# Patient Record
Sex: Female | Born: 1955 | Race: White | Hispanic: No | Smoking: Former smoker
Health system: Southern US, Community
[De-identification: ages and names within clinical notes are randomized; demographics above are authoritative.]

## PROBLEM LIST (undated history)

## (undated) DIAGNOSIS — F32A Depression, unspecified: Secondary | ICD-10-CM

## (undated) DIAGNOSIS — E538 Deficiency of other specified B group vitamins: Secondary | ICD-10-CM

## (undated) DIAGNOSIS — G43909 Migraine, unspecified, not intractable, without status migrainosus: Secondary | ICD-10-CM

## (undated) DIAGNOSIS — F329 Major depressive disorder, single episode, unspecified: Secondary | ICD-10-CM

## (undated) DIAGNOSIS — F419 Anxiety disorder, unspecified: Secondary | ICD-10-CM

## (undated) DIAGNOSIS — E785 Hyperlipidemia, unspecified: Secondary | ICD-10-CM

## (undated) DIAGNOSIS — L409 Psoriasis, unspecified: Secondary | ICD-10-CM

## (undated) DIAGNOSIS — Z8619 Personal history of other infectious and parasitic diseases: Secondary | ICD-10-CM

## (undated) DIAGNOSIS — T7840XA Allergy, unspecified, initial encounter: Secondary | ICD-10-CM

## (undated) DIAGNOSIS — Z860100 Personal history of colon polyps, unspecified: Secondary | ICD-10-CM

## (undated) DIAGNOSIS — K5792 Diverticulitis of intestine, part unspecified, without perforation or abscess without bleeding: Secondary | ICD-10-CM

## (undated) DIAGNOSIS — K219 Gastro-esophageal reflux disease without esophagitis: Secondary | ICD-10-CM

## (undated) HISTORY — DX: Major depressive disorder, single episode, unspecified: F32.9

## (undated) HISTORY — DX: Hyperlipidemia, unspecified: E78.5

## (undated) HISTORY — DX: Personal history of colonic polyps: Z86.010

## (undated) HISTORY — DX: Deficiency of other specified B group vitamins: E53.8

## (undated) HISTORY — DX: Personal history of colon polyps, unspecified: Z86.0100

## (undated) HISTORY — DX: Migraine, unspecified, not intractable, without status migrainosus: G43.909

## (undated) HISTORY — PX: BREAST CYST ASPIRATION: SHX578

## (undated) HISTORY — PX: CHOLECYSTECTOMY: SHX55

## (undated) HISTORY — DX: Anxiety disorder, unspecified: F41.9

## (undated) HISTORY — PX: BREAST SURGERY: SHX581

## (undated) HISTORY — DX: Psoriasis, unspecified: L40.9

## (undated) HISTORY — DX: Personal history of other infectious and parasitic diseases: Z86.19

## (undated) HISTORY — PX: ABDOMINAL HYSTERECTOMY: SHX81

## (undated) HISTORY — DX: Diverticulitis of intestine, part unspecified, without perforation or abscess without bleeding: K57.92

## (undated) HISTORY — DX: Gastro-esophageal reflux disease without esophagitis: K21.9

## (undated) HISTORY — DX: Allergy, unspecified, initial encounter: T78.40XA

## (undated) HISTORY — DX: Depression, unspecified: F32.A

---

## 1973-04-23 HISTORY — PX: TONSILLECTOMY: SUR1361

## 2005-11-06 ENCOUNTER — Emergency Department: Payer: Self-pay | Admitting: Unknown Physician Specialty

## 2006-05-15 ENCOUNTER — Other Ambulatory Visit: Payer: Self-pay

## 2006-05-15 ENCOUNTER — Inpatient Hospital Stay: Payer: Self-pay | Admitting: Surgery

## 2007-10-27 ENCOUNTER — Inpatient Hospital Stay: Payer: Self-pay | Admitting: Internal Medicine

## 2009-04-19 ENCOUNTER — Ambulatory Visit: Payer: Self-pay | Admitting: Family Medicine

## 2009-04-21 ENCOUNTER — Ambulatory Visit: Payer: Self-pay | Admitting: Family Medicine

## 2009-05-05 ENCOUNTER — Ambulatory Visit: Payer: Self-pay | Admitting: Family Medicine

## 2010-02-07 ENCOUNTER — Ambulatory Visit: Payer: Self-pay | Admitting: Family Medicine

## 2010-04-06 ENCOUNTER — Ambulatory Visit: Payer: Self-pay | Admitting: Family Medicine

## 2010-04-21 ENCOUNTER — Other Ambulatory Visit: Payer: Self-pay

## 2010-04-28 ENCOUNTER — Other Ambulatory Visit: Payer: Self-pay

## 2010-04-28 ENCOUNTER — Ambulatory Visit: Payer: Self-pay | Admitting: Unknown Physician Specialty

## 2010-05-01 ENCOUNTER — Ambulatory Visit: Payer: Self-pay | Admitting: Unknown Physician Specialty

## 2010-05-04 ENCOUNTER — Ambulatory Visit: Payer: Self-pay | Admitting: Family Medicine

## 2011-01-08 LAB — HM COLONOSCOPY

## 2011-05-07 ENCOUNTER — Ambulatory Visit: Payer: Self-pay | Admitting: Internal Medicine

## 2012-05-07 ENCOUNTER — Ambulatory Visit: Payer: Self-pay | Admitting: Internal Medicine

## 2012-06-21 IMAGING — CT CT ABD-PELV W/ CM
1 of 2 series · 15 of 32 positions shown, 19 images · non-contrast
Comparison: none

REASON FOR EXAM: abd pain with bloating RUQ tenderness
COMMENTS:  creatinine 03/28/2010 0.9 egfr >60

[Series 2: abd with 5.0 i40f · axial · 0.96mm/px · z∈[-1120,-675]mm · 15 of 99 slices shown, 19 images]
[im 5/99  soft-tissue]
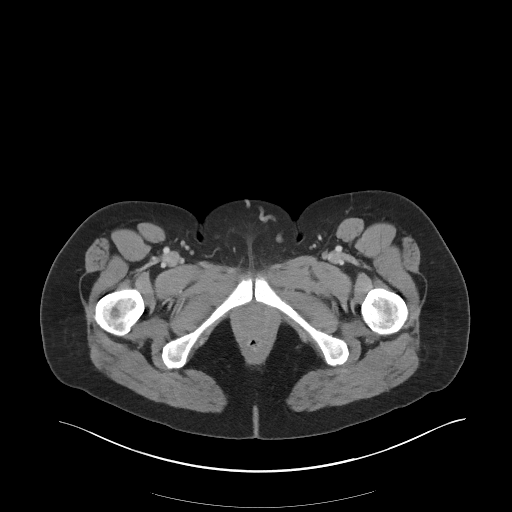
[im 5/99  bone]
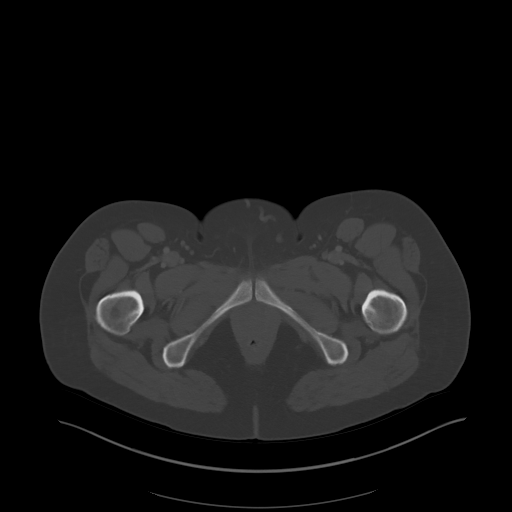
[im 14/99  soft-tissue]
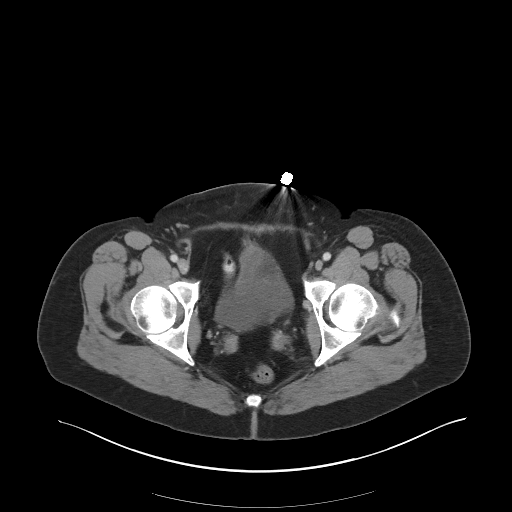
[im 23/99  soft-tissue]
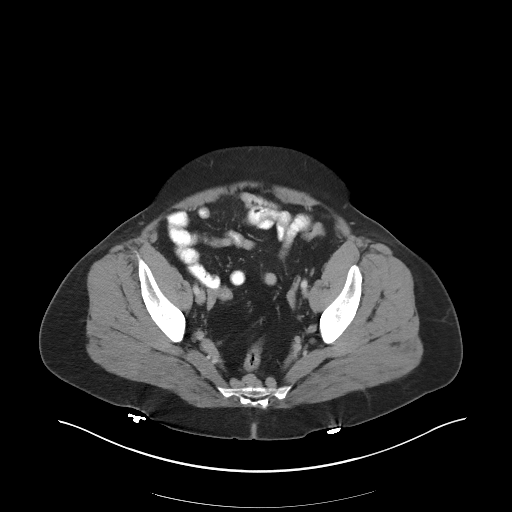
[im 27/99  soft-tissue]
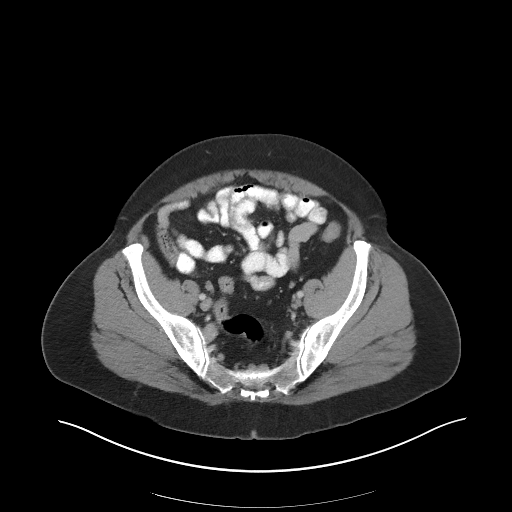
[im 36/99  soft-tissue]
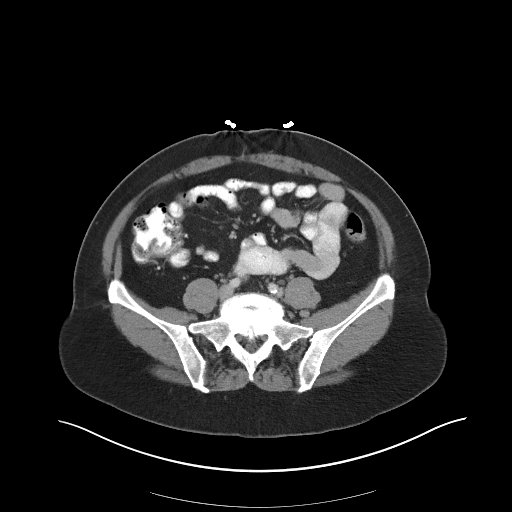
[im 41/99  soft-tissue]
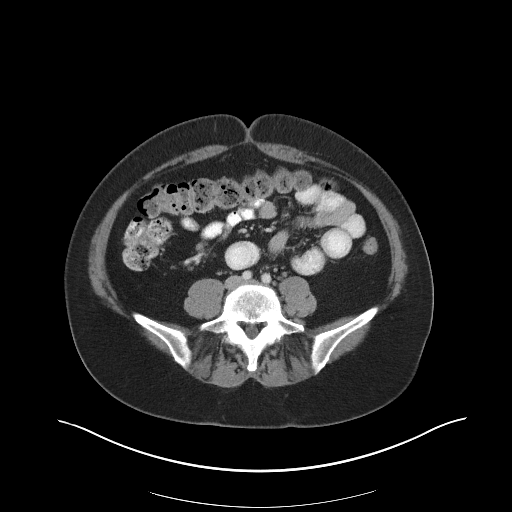
[im 49/99  soft-tissue]
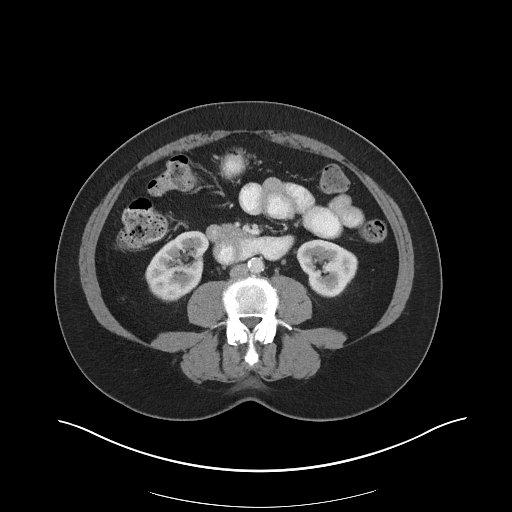
[im 58/99  soft-tissue]
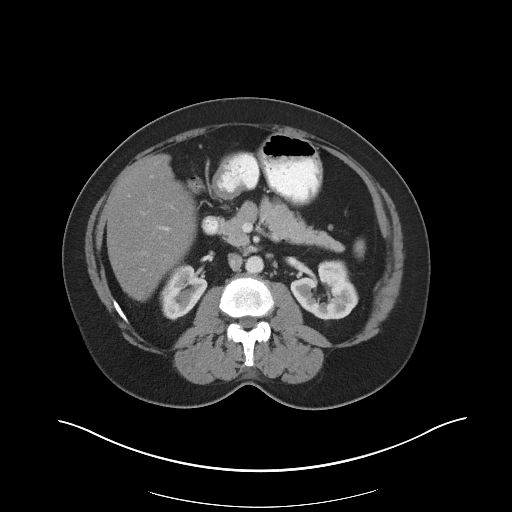
[im 63/99  soft-tissue]
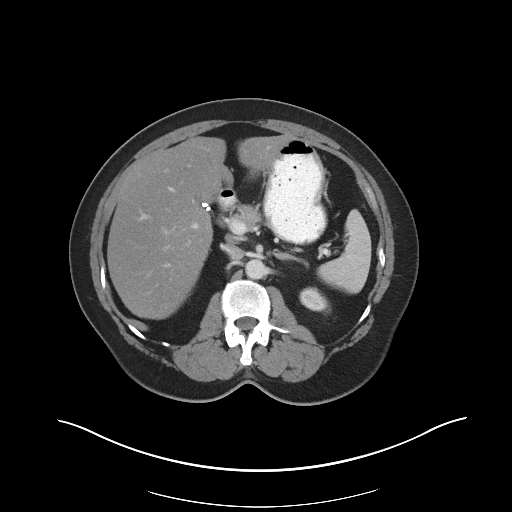
[im 63/99  bone]
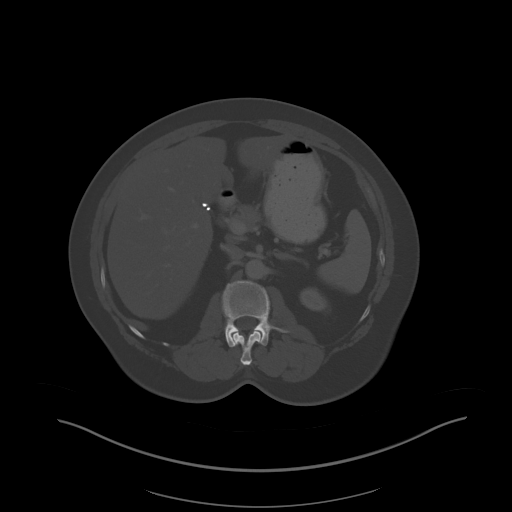
[im 72/99  soft-tissue]
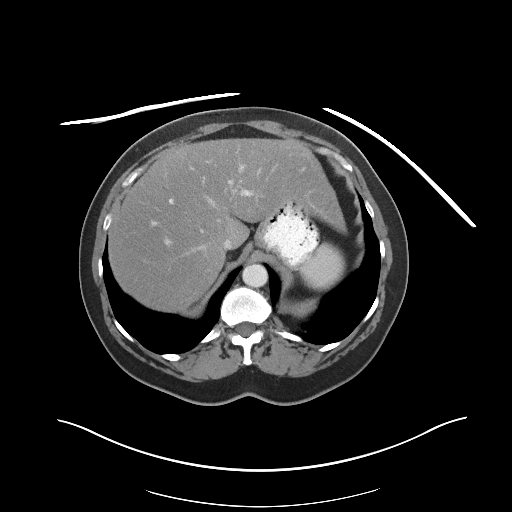
[im 76/99  soft-tissue]
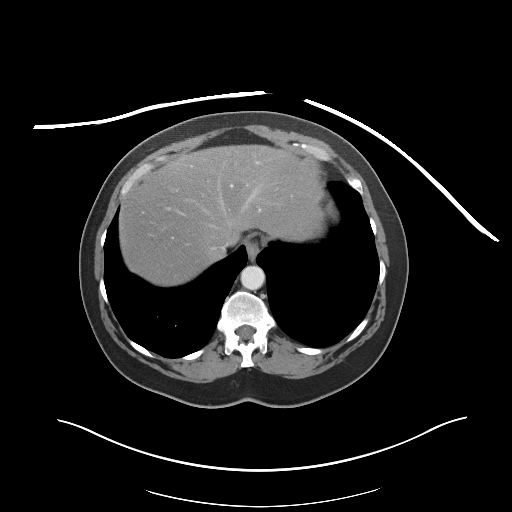
[im 81/99  lung]
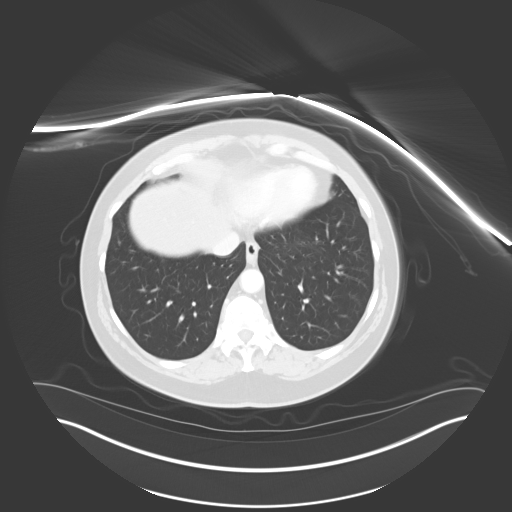
[im 85/99  soft-tissue]
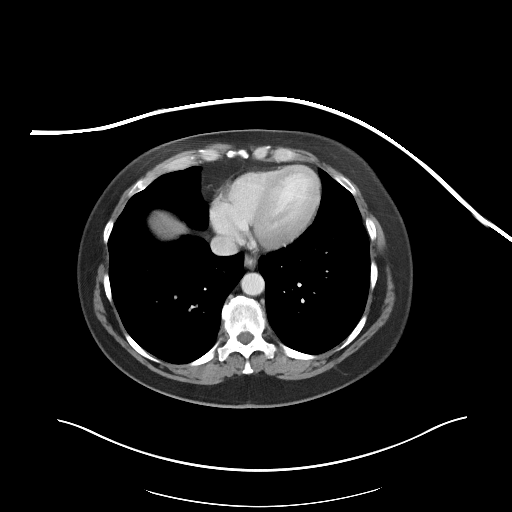
[im 85/99  lung]
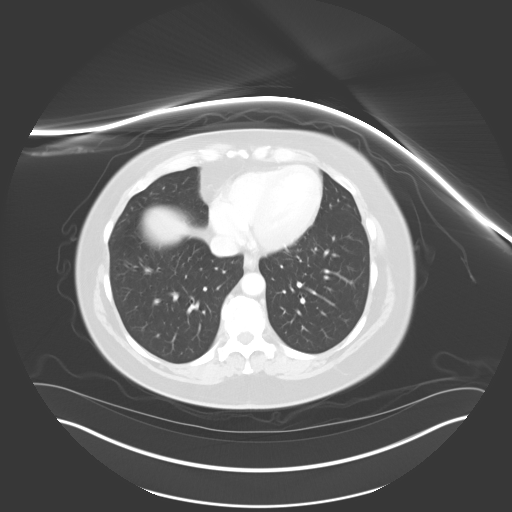
[im 90/99  lung]
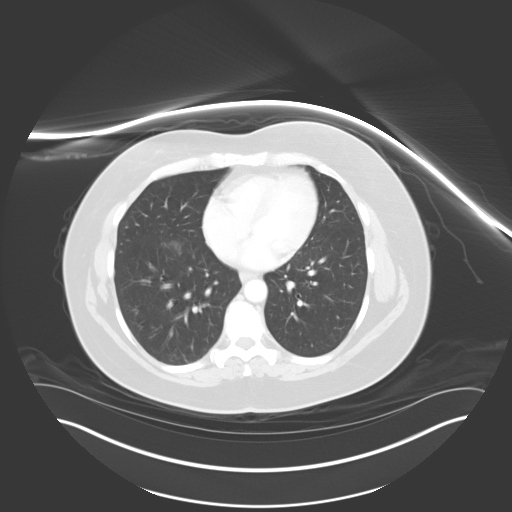
[im 94/99  soft-tissue]
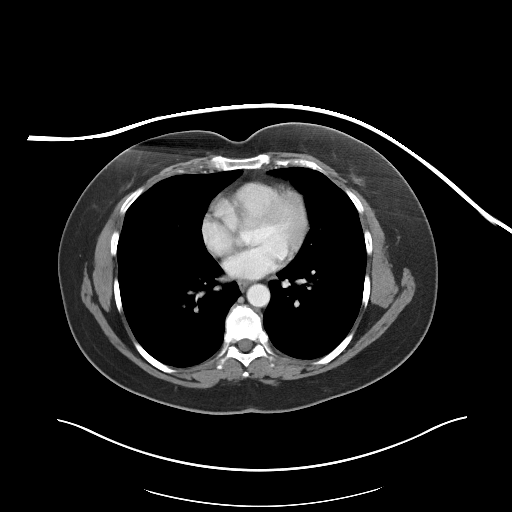
[im 94/99  lung]
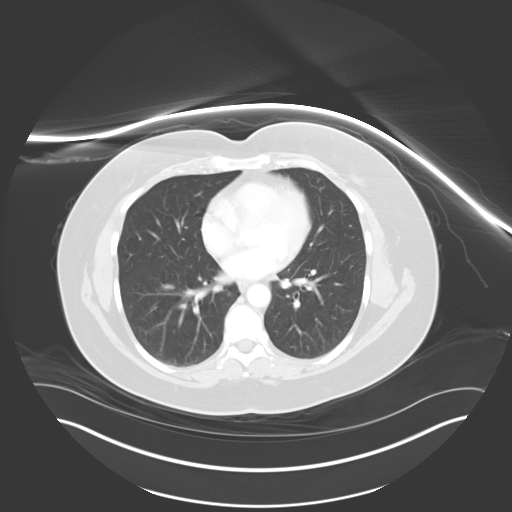

[15 of 32 positions shown; findings below may reference images not displayed]

PROCEDURE:     KCT - KCT ABDOMEN/PELVIS W  - April 06, 2010  [DATE]

RESULT:     CT of the abdomen and pelvis is performed utilizing 100 mL of
Fsovue-7BG iodinated intravenous contrast and oral contrast. Images through
the base of the lungs demonstrate some minimal dependent atelectasis without
infiltrate, mass, edema, effusion or pneumothorax.

There is low-attenuation of the liver consistent with diffuse fatty
infiltration. The spleen is unremarkable. The distal esophagus appears
within normal limits. The adrenal glands are unremarkable. The aorta is
normal in caliber with limited atherosclerotic calcification. The kidneys
show no obstruction or abnormal enhancement. Cholecystectomy clips are
present. The pancreas is unremarkable. There is no evidence of abnormal
bowel distention. There is some sigmoid diverticulosis. Some minimal
adjacent inflammation may be present laterally in the left pelvis in the
area of image 83 and 84 which could represent very mild diverticulitis.
Given that the symptoms are reported in the right upper quadrant this is
likely not significant. No ascites or abscess is present. There is no free
air. It appears the patient is status post hysterectomy. The urinary bladder
is nondistended. No adenopathy is evident. The appendix appears unremarkable.
IMPRESSION: 1. Fatty infiltration of the liver with cholecystectomy changes.
2. Sigmoid diverticulosis. The possibility of some very minimal sigmoid
diverticulitis is not excluded.

## 2012-07-23 ENCOUNTER — Ambulatory Visit: Payer: Self-pay | Admitting: General Practice

## 2013-03-16 ENCOUNTER — Other Ambulatory Visit: Payer: Self-pay | Admitting: Internal Medicine

## 2013-03-16 LAB — BASIC METABOLIC PANEL
Anion Gap: 8 (ref 7–16)
BUN: 14 mg/dL (ref 7–18)
Calcium, Total: 9.2 mg/dL (ref 8.5–10.1)
Chloride: 105 mmol/L (ref 98–107)
Co2: 26 mmol/L (ref 21–32)
Creatinine: 0.85 mg/dL (ref 0.60–1.30)
EGFR (African American): >60
EGFR (Non-African Amer.): >60
Glucose: 103 mg/dL — ABNORMAL HIGH (ref 65–99)
Potassium: 3.8 mmol/L (ref 3.5–5.1)
Sodium: 139 mmol/L (ref 136–145)

## 2013-03-16 LAB — LIPID PANEL
Cholesterol: 287 mg/dL — ABNORMAL HIGH (ref 0–200)
HDL Cholesterol: 44 mg/dL (ref 40–60)
Triglycerides: 209 mg/dL — ABNORMAL HIGH (ref 0–200)

## 2013-05-11 ENCOUNTER — Ambulatory Visit: Payer: Self-pay | Admitting: Internal Medicine

## 2013-08-08 ENCOUNTER — Emergency Department: Payer: Self-pay | Admitting: Emergency Medicine

## 2013-10-28 ENCOUNTER — Encounter (INDEPENDENT_AMBULATORY_CARE_PROVIDER_SITE_OTHER): Payer: Self-pay

## 2013-10-28 ENCOUNTER — Encounter: Payer: Self-pay | Admitting: Adult Health

## 2013-10-28 ENCOUNTER — Ambulatory Visit (INDEPENDENT_AMBULATORY_CARE_PROVIDER_SITE_OTHER): Payer: 59 | Admitting: Adult Health

## 2013-10-28 VITALS — BP 143/89 | HR 67 | Temp 97.9°F | Resp 14 | Ht 66.25 in | Wt 176.5 lb

## 2013-10-28 DIAGNOSIS — F32A Depression, unspecified: Secondary | ICD-10-CM

## 2013-10-28 DIAGNOSIS — E538 Deficiency of other specified B group vitamins: Secondary | ICD-10-CM

## 2013-10-28 DIAGNOSIS — F3289 Other specified depressive episodes: Secondary | ICD-10-CM

## 2013-10-28 DIAGNOSIS — F329 Major depressive disorder, single episode, unspecified: Secondary | ICD-10-CM

## 2013-10-28 DIAGNOSIS — I10 Essential (primary) hypertension: Secondary | ICD-10-CM

## 2013-10-28 DIAGNOSIS — K219 Gastro-esophageal reflux disease without esophagitis: Secondary | ICD-10-CM

## 2013-10-28 DIAGNOSIS — E785 Hyperlipidemia, unspecified: Secondary | ICD-10-CM

## 2013-10-28 LAB — COMPREHENSIVE METABOLIC PANEL
ALT: 24 U/L (ref 0–35)
AST: 22 U/L (ref 0–37)
Albumin: 4 g/dL (ref 3.5–5.2)
Alkaline Phosphatase: 81 U/L (ref 39–117)
BILIRUBIN TOTAL: 0.5 mg/dL (ref 0.2–1.2)
BUN: 15 mg/dL (ref 6–23)
CALCIUM: 9.3 mg/dL (ref 8.4–10.5)
CHLORIDE: 105 meq/L (ref 96–112)
CO2: 27 meq/L (ref 19–32)
Creatinine, Ser: 0.8 mg/dL (ref 0.4–1.2)
GFR: 76.55 mL/min (ref >60.00)
Glucose, Bld: 109 mg/dL — ABNORMAL HIGH (ref 70–99)
Potassium: 4.4 mEq/L (ref 3.5–5.1)
SODIUM: 141 meq/L (ref 135–145)
TOTAL PROTEIN: 7.3 g/dL (ref 6.0–8.3)

## 2013-10-28 LAB — CBC WITH DIFFERENTIAL/PLATELET
BASOS ABS: 0 10*3/uL (ref 0.0–0.1)
Basophils Relative: 0.6 % (ref 0.0–3.0)
EOS ABS: 0.1 10*3/uL (ref 0.0–0.7)
Eosinophils Relative: 2.2 % (ref 0.0–5.0)
HCT: 39.6 % (ref 36.0–46.0)
Hemoglobin: 13.1 g/dL (ref 12.0–15.0)
LYMPHS PCT: 41.1 % (ref 12.0–46.0)
Lymphs Abs: 2.6 10*3/uL (ref 0.7–4.0)
MCHC: 33.1 g/dL (ref 30.0–36.0)
MCV: 82.9 fl (ref 78.0–100.0)
MONOS PCT: 10.7 % (ref 3.0–12.0)
Monocytes Absolute: 0.7 10*3/uL (ref 0.1–1.0)
NEUTROS PCT: 45.4 % (ref 43.0–77.0)
Neutro Abs: 2.9 10*3/uL (ref 1.4–7.7)
Platelets: 268 10*3/uL (ref 150.0–400.0)
RBC: 4.78 Mil/uL (ref 3.87–5.11)
RDW: 15 % (ref 11.5–15.5)
WBC: 6.3 10*3/uL (ref 4.0–10.5)

## 2013-10-28 LAB — LIPID PANEL
Cholesterol: 276 mg/dL — ABNORMAL HIGH (ref 0–200)
HDL: 46.1 mg/dL (ref >39.00)
LDL CALC: 192 mg/dL — AB (ref 0–99)
NONHDL: 229.9
Total CHOL/HDL Ratio: 6
Triglycerides: 188 mg/dL — ABNORMAL HIGH (ref 0.0–149.0)
VLDL: 37.6 mg/dL (ref 0.0–40.0)

## 2013-10-28 LAB — TSH: TSH: 1.22 u[IU]/mL (ref 0.35–4.50)

## 2013-10-28 LAB — FOLATE: FOLATE: 14.8 ng/mL (ref >5.9)

## 2013-10-28 MED ORDER — ZOSTER VACCINE LIVE 19400 UNT/0.65ML ~~LOC~~ SOLR: 0.6500 mL | Freq: Once | SUBCUTANEOUS | Status: DC

## 2013-10-28 MED ORDER — OMEGA-3 1000 MG PO CAPS: ORAL_CAPSULE | ORAL | Status: DC

## 2013-10-28 MED ORDER — TRIAMCINOLONE ACETONIDE 0.1 % EX CREA: 1.0000 "application " | TOPICAL_CREAM | Freq: Two times a day (BID) | CUTANEOUS | Status: DC

## 2013-10-28 NOTE — Progress Notes (Signed)
Patient ID: Mikayla Cuevas, female   DOB: 01/17/1956, 58 y.o.   MRN: [id number]   Subjective:    Patient ID: Mikayla Cuevas, female    DOB: [birth year], 58 y.o.   MRN: [id number]  HPI Pt is a 58 y/o female who presents to clinic to establish care. Previously followed at [facility]. Will request records. Hx of HLD intolerant to any lipid lowering medication. Also, hx of GERD. As long as she takes medication she is ok. If she misses a dose she immediately feels it.  PAP - s/p hysterectomy Mammogram - January 2015 - normal Last Physical - November 2014 Tetanus - UTD    Past Medical History  Diagnosis Date  . Depression   . GERD (gastroesophageal reflux disease)   . Allergy   . Hyperlipidemia   . History of chicken pox   . History of colon polyps     Colonoscopy 2012 - repeat in 10 years - Dr. Vira Agar  . Migraine   . Diverticulitis   . Psoriasis     Clobetasol   . B12 deficiency     Past Surgical History  Procedure Laterality Date  . Abdominal hysterectomy    . Cholecystectomy    . Tonsillectomy  1975  . Breast surgery      Family History  Problem Relation Age of Onset  . Hyperlipidemia Mother   . Hypertension Mother   . Alcohol abuse Father   . Cancer Father     breast  . Hyperlipidemia Father   . Depression Father   . Cancer Sister     2 sisters with breast cancer  . Cancer Paternal Aunt     breast   . Hyperlipidemia Maternal Grandmother   . Hypertension Maternal Grandmother     History   Social History  . Marital Status: Divorced    Spouse Name: N/A    Number of Children: 2  . Years of Education: N/A   Occupational History  . ER Tech     [employer]   Social History Main Topics  . Smoking status: Former Smoker -- 1.00 packs/day for 35 years  . Smokeless tobacco: Former Systems developer    Quit date: 04/29/1989  . Alcohol Use: No  . Drug Use: No  . Sexual Activity: Not on file   Other Topics Concern  . Not on file   Social History Narrative   Hobbies: grandchildren, guitar    Caffeine: 1 cup daily   Exercise: Not at present   Diet: needs improvement    Review of Systems  Constitutional: Negative.   HENT: Negative.   Eyes: Negative.   Respiratory: Negative.   Cardiovascular: Negative.   Gastrointestinal: Negative.   Endocrine: Negative.   Genitourinary:       Burning and irritation of genital area. Some incontinence. Hx of psoriasis.  Musculoskeletal: Negative.   Skin: Negative.   Allergic/Immunologic: Negative.   Neurological: Negative.   Hematological: Negative.   Psychiatric/Behavioral: Negative.        Objective:  BP 143/89  Pulse 67  Temp(Src) 97.9 F (36.6 C) (Oral)  Resp 14  Ht 5' 6.25" (1.683 m)  Wt 176 lb 8 oz (80.06 kg)  BMI 28.26 kg/m2  SpO2 97%   Physical Exam  Constitutional: She is oriented to person, place, and time. No distress.  HENT:  Head: Normocephalic and atraumatic.  Eyes: Conjunctivae and EOM are normal.  Neck: Normal range of motion. Neck supple.  Cardiovascular: Normal rate, regular rhythm, normal heart sounds and intact  distal pulses.  Exam reveals no gallop and no friction rub.   No murmur heard. Pulmonary/Chest: Effort normal and breath sounds normal. No respiratory distress. She has no wheezes. She has no rales.  Musculoskeletal: Normal range of motion.  Neurological: She is alert and oriented to person, place, and time. She has normal reflexes. Coordination normal.  Skin: Skin is warm and dry.  Psychiatric: She has a normal mood and affect. Her behavior is normal. Judgment and thought content normal.      Assessment & Plan:   1. Essential hypertension Hx of elevated b/p without diagnosis. Slightly elevated in clinic today. I have asked her to monitor daily and record. She will bring these readings back with her on her follow up visit in approximately 2 weeks. Check labs. - Comprehensive metabolic panel - CBC with Differential  2. HLD (hyperlipidemia) Does not tolerate any statin or other lipid  lowering medication 2/2 myalgias. Start omega 3 fatty acids. Gradually increase to 4,000 mg daily. Freeze if having fishy burping. Check labs. - Lipid panel  3. Gastroesophageal reflux disease without esophagitis Well controlled on medication. Follow.  4. Depression Well controlled on her Effexor. Continue to follow.    5. B12 deficiency Hx of def. I will check her folate. She does not recall that this has been checked. On B12 supplements. - Folate

## 2013-10-28 NOTE — Progress Notes (Signed)
Pre visit review using our clinic review tool, if applicable. No additional management support is needed unless otherwise documented below in the visit note. 

## 2013-10-28 NOTE — Patient Instructions (Signed)
   Thank you for choosing [place] [facility] for your health care needs.  Please have your labs drawn prior to leaving the office.  The results will be available through MyChart for your convenience. Please remember to activate this. The activation code is located at the end of this form.  Monitor your blood pressure daily for 2 weeks and records. Please bring readings with you at next appointment.  Start triamcinolone cream to affected area twice daily. Please come to see me if no improvement within 2 weeks.

## 2013-10-29 ENCOUNTER — Telehealth: Payer: Self-pay | Admitting: Adult Health

## 2013-10-29 NOTE — Telephone Encounter (Signed)
Relevant patient education assigned to patient using Emmi. ° °

## 2014-03-05 DIAGNOSIS — E7800 Pure hypercholesterolemia, unspecified: Secondary | ICD-10-CM | POA: Insufficient documentation

## 2014-03-05 DIAGNOSIS — L409 Psoriasis, unspecified: Secondary | ICD-10-CM | POA: Insufficient documentation

## 2014-03-05 DIAGNOSIS — D510 Vitamin B12 deficiency anemia due to intrinsic factor deficiency: Secondary | ICD-10-CM | POA: Insufficient documentation

## 2014-03-05 DIAGNOSIS — I700 Atherosclerosis of aorta: Secondary | ICD-10-CM | POA: Insufficient documentation

## 2014-03-05 DIAGNOSIS — F324 Major depressive disorder, single episode, in partial remission: Secondary | ICD-10-CM | POA: Insufficient documentation

## 2014-04-08 ENCOUNTER — Telehealth: Payer: Self-pay | Admitting: Internal Medicine

## 2014-04-08 ENCOUNTER — Ambulatory Visit (INDEPENDENT_AMBULATORY_CARE_PROVIDER_SITE_OTHER): Payer: 59 | Admitting: Internal Medicine

## 2014-04-08 ENCOUNTER — Encounter: Payer: Self-pay | Admitting: Internal Medicine

## 2014-04-08 VITALS — BP 140/80 | HR 88 | Temp 98.6°F | Ht 67.0 in | Wt 180.0 lb

## 2014-04-08 DIAGNOSIS — E559 Vitamin D deficiency, unspecified: Secondary | ICD-10-CM

## 2014-04-08 DIAGNOSIS — E785 Hyperlipidemia, unspecified: Secondary | ICD-10-CM

## 2014-04-08 DIAGNOSIS — Z803 Family history of malignant neoplasm of breast: Secondary | ICD-10-CM | POA: Insufficient documentation

## 2014-04-08 DIAGNOSIS — E538 Deficiency of other specified B group vitamins: Secondary | ICD-10-CM

## 2014-04-08 DIAGNOSIS — Z0000 Encounter for general adult medical examination without abnormal findings: Secondary | ICD-10-CM

## 2014-04-08 DIAGNOSIS — B029 Zoster without complications: Secondary | ICD-10-CM

## 2014-04-08 DIAGNOSIS — Z1239 Encounter for other screening for malignant neoplasm of breast: Secondary | ICD-10-CM

## 2014-04-08 DIAGNOSIS — R5383 Other fatigue: Secondary | ICD-10-CM

## 2014-04-08 DIAGNOSIS — Z90710 Acquired absence of both cervix and uterus: Secondary | ICD-10-CM | POA: Insufficient documentation

## 2014-04-08 DIAGNOSIS — I8312 Varicose veins of left lower extremity with inflammation: Secondary | ICD-10-CM

## 2014-04-08 DIAGNOSIS — I872 Venous insufficiency (chronic) (peripheral): Secondary | ICD-10-CM | POA: Insufficient documentation

## 2014-04-08 DIAGNOSIS — Z1159 Encounter for screening for other viral diseases: Secondary | ICD-10-CM

## 2014-04-08 LAB — CBC WITH DIFFERENTIAL/PLATELET
Basophils Absolute: 0 K/uL (ref 0.0–0.1)
Basophils Relative: 0.5 % (ref 0.0–3.0)
Eosinophils Absolute: 0.2 K/uL (ref 0.0–0.7)
Eosinophils Relative: 2 % (ref 0.0–5.0)
HCT: 40.2 % (ref 36.0–46.0)
Hemoglobin: 12.8 g/dL (ref 12.0–15.0)
Lymphocytes Relative: 30.1 % (ref 12.0–46.0)
Lymphs Abs: 2.7 K/uL (ref 0.7–4.0)
MCHC: 31.9 g/dL (ref 30.0–36.0)
MCV: 82.7 fl (ref 78.0–100.0)
Monocytes Absolute: 0.6 K/uL (ref 0.1–1.0)
Monocytes Relative: 7.1 % (ref 3.0–12.0)
Neutro Abs: 5.4 K/uL (ref 1.4–7.7)
Neutrophils Relative %: 60.3 % (ref 43.0–77.0)
Platelets: 303 K/uL (ref 150.0–400.0)
RBC: 4.86 Mil/uL (ref 3.87–5.11)
RDW: 14.9 % (ref 11.5–15.5)
WBC: 8.9 K/uL (ref 4.0–10.5)

## 2014-04-08 LAB — VITAMIN B12: Vitamin B-12: >1500 pg/mL — ABNORMAL HIGH (ref 211–911)

## 2014-04-08 LAB — COMPREHENSIVE METABOLIC PANEL
ALBUMIN: 4.1 g/dL (ref 3.5–5.2)
ALK PHOS: 91 U/L (ref 39–117)
ALT: 16 U/L (ref 0–35)
AST: 17 U/L (ref 0–37)
BILIRUBIN TOTAL: 0.4 mg/dL (ref 0.2–1.2)
BUN: 16 mg/dL (ref 6–23)
CO2: 26 mEq/L (ref 19–32)
Calcium: 9.4 mg/dL (ref 8.4–10.5)
Chloride: 102 mEq/L (ref 96–112)
Creatinine, Ser: 0.9 mg/dL (ref 0.4–1.2)
GFR: 72.3 mL/min (ref >60.00)
GLUCOSE: 91 mg/dL (ref 70–99)
POTASSIUM: 4 meq/L (ref 3.5–5.1)
SODIUM: 139 meq/L (ref 135–145)
TOTAL PROTEIN: 7.1 g/dL (ref 6.0–8.3)

## 2014-04-08 LAB — LIPID PANEL
CHOL/HDL RATIO: 7
Cholesterol: 295 mg/dL — ABNORMAL HIGH (ref 0–200)
HDL: 41.2 mg/dL (ref >39.00)
NonHDL: 253.8
TRIGLYCERIDES: 218 mg/dL — AB (ref 0.0–149.0)
VLDL: 43.6 mg/dL — AB (ref 0.0–40.0)

## 2014-04-08 LAB — LDL CHOLESTEROL, DIRECT: Direct LDL: 222.9 mg/dL

## 2014-04-08 LAB — TSH: TSH: 1.48 u[IU]/mL (ref 0.35–4.50)

## 2014-04-08 LAB — VITAMIN D 25 HYDROXY (VIT D DEFICIENCY, FRACTURES): VITD: 54.87 ng/mL (ref 30.00–100.00)

## 2014-04-08 MED ORDER — TRIAMCINOLONE ACETONIDE 0.1 % EX CREA: 1.0000 "application " | TOPICAL_CREAM | Freq: Two times a day (BID) | CUTANEOUS | Status: DC

## 2014-04-08 MED ORDER — VENLAFAXINE HCL ER 150 MG PO TB24: 150.0000 mg | ORAL_TABLET | Freq: Two times a day (BID) | ORAL | Status: DC

## 2014-04-08 MED ORDER — OMEPRAZOLE 40 MG PO CPDR: 40.0000 mg | DELAYED_RELEASE_CAPSULE | Freq: Every day | ORAL | Status: DC

## 2014-04-08 MED ORDER — ACYCLOVIR 800 MG PO TABS: 800.0000 mg | ORAL_TABLET | Freq: Every day | ORAL | Status: DC

## 2014-04-08 MED ORDER — CLOBETASOL PROPIONATE 0.05 % EX CREA
TOPICAL_CREAM | Freq: Two times a day (BID) | CUTANEOUS | Status: DC
Start: 2014-04-08 — End: 2015-07-25

## 2014-04-08 NOTE — Telephone Encounter (Signed)
Yes sending add ons

## 2014-04-08 NOTE — Progress Notes (Signed)
Patient ID: Mikayla Cuevas, female   DOB: [birth year], 58 y.o.   MRN: [id number]   Subjective:     Mikayla Cuevas is a 58 y.o. female and is here for a comprehensive physical exam. The patient reports no problems.  History   Social History  . Marital Status: Divorced    Spouse Name: N/A    Number of Children: 2  . Years of Education: N/A   Occupational History  . ER Tech     [employer]   Social History Main Topics  . Smoking status: Former Smoker -- 1.00 packs/day for 35 years  . Smokeless tobacco: Former Systems developer    Quit date: 04/29/1989  . Alcohol Use: No  . Drug Use: No  . Sexual Activity: Not on file   Other Topics Concern  . Not on file   Social History Narrative   Hobbies: grandchildren, guitar   Caffeine: 1 cup daily   Exercise: Not at present   Diet: needs improvement   Health Maintenance  Topic Date Due  . PAP SMEAR  05/31/1971  . MAMMOGRAM  05/31/2003  . INFLUENZA VACCINE  11/22/2014  . TETANUS/TDAP  04/09/2019  . COLONOSCOPY  01/07/2021  . ZOSTAVAX  Completed    The following portions of the patient's history were reviewed and updated as appropriate: allergies, current medications, past family history, past medical history, past social history, past surgical history and problem list.  Review of Systems A comprehensive review of systems was negative.   Objective:   BP 140/80 mmHg  Pulse 88  Temp(Src) 98.6 F (37 C) (Oral)  Ht 5\' 7"  (1.702 m)  Wt 180 lb (81.647 kg)  BMI 28.19 kg/m2  SpO2 97%  General appearance: alert, cooperative and appears stated age Head: Normocephalic, without obvious abnormality, atraumatic Eyes: conjunctivae/corneas clear. PERRL, EOM's intact. Fundi benign. Ears: normal TM's and external ear canals both ears Nose: Nares normal. Septum midline. Mucosa normal. No drainage or sinus tenderness. Throat: lips, mucosa, and tongue normal; teeth and gums normal Neck: no adenopathy, no carotid bruit, no JVD, supple, symmetrical, trachea  midline and thyroid not enlarged, symmetric, no tenderness/mass/nodules Lungs: clear to auscultation bilaterally Breasts: normal appearance, no masses or tenderness Heart: regular rate and rhythm, S1, S2 normal, no murmur, click, rub or gallop Abdomen: soft, non-tender; bowel sounds normal; no masses,  no organomegaly Extremities: extremities normal, atraumatic, no cyanosis or edema Pulses: 2+ and symmetric Skin: Skin color, texture, turgor normal. No rashes or lesions Neurologic: Alert and oriented X 3, normal strength and tone. Normal symmetric reflexes. Normal coordination and gait.    Assessment and Plan   Encounter for preventive health examination Annual wellness  exam was done as well as a comprehensive physical exam and management of acute and chronic conditions .  During the course of the visit the patient was educated and counseled about appropriate screening and preventive services including :  diabetes screening, lipid analysis with projected  10 year  risk for CAD , nutrition counseling, colorectal cancer screening, and recommended immunizations.  Printed recommendations for health maintenance screenings was given.    Updated Medication List Outpatient Encounter Prescriptions as of 04/08/2014  Medication Sig  . acyclovir (ZOVIRAX) 800 MG tablet Take 1 tablet (800 mg total) by mouth 5 (five) times daily.  Marland Kitchen aspirin 81 MG tablet Take 81 mg by mouth daily.  . Calcium Carb-Cholecalciferol (CALCIUM + D3 PO) Take 1 tablet by mouth daily.  . Cholecalciferol (VITAMIN D3) 2000 UNITS TABS Take 1  tablet by mouth daily.  . clobetasol cream (TEMOVATE) 0.05 % Apply topically 2 (two) times daily.  . Cyanocobalamin (VITAMIN B-12 PO) Take 2,500 mcg by mouth daily.  . Omega-3 1000 MG CAPS Take 1 capsule twice daily  . omeprazole (PRILOSEC) 40 MG capsule Take 1 capsule (40 mg total) by mouth daily.  Marland Kitchen triamcinolone cream (KENALOG) 0.1 % Apply 1 application topically 2 (two) times daily.  .  Venlafaxine HCl 150 MG TB24 Take 1 tablet (150 mg total) by mouth 2 (two) times daily.  . [DISCONTINUED] clobetasol cream (TEMOVATE) 0.05 % Apply to affected area(s) 2 times per day  . [DISCONTINUED] Cranberry-Vitamin C-Probiotic (AZO CRANBERRY PO) Take 2 capsules by mouth daily.  . [DISCONTINUED] fluconazole (DIFLUCAN) 100 MG tablet Take 100 mg by mouth daily.  . [DISCONTINUED] omeprazole (PRILOSEC) 40 MG capsule Take 40 mg by mouth daily.  . [DISCONTINUED] triamcinolone cream (KENALOG) 0.1 % Apply 1 application topically 2 (two) times daily.  . [DISCONTINUED] Venlafaxine HCl 150 MG TB24 Take 150 mg by mouth 2 (two) times daily.  . [DISCONTINUED] zoster vaccine live, PF, (ZOSTAVAX) 82993 UNT/0.65ML injection Inject 19,400 Units into the skin once.

## 2014-04-08 NOTE — Patient Instructions (Signed)
You had your annual  wellness exam today.  We will repeat your elvic exam  in 2016, sooner if needed   We will schedule your mammogram in January at [facility]  We will contact you with the bloodwork results  [email or web address]  For compression stockings   Health Maintenance Adopting a healthy lifestyle and getting preventive care can go a long way to promote health and wellness. Talk with your health care provider about what schedule of regular examinations is right for you. This is a good chance for you to check in with your provider about disease prevention and staying healthy. In between checkups, there are plenty of things you can do on your own. Experts have done a lot of research about which lifestyle changes and preventive measures are most likely to keep you healthy. Ask your health care provider for more information. WEIGHT AND DIET  Eat a healthy diet  Be sure to include plenty of vegetables, fruits, low-fat dairy products, and lean protein.  Do not eat a lot of foods high in solid fats, added sugars, or salt.  Get regular exercise. This is one of the most important things you can do for your health.  Most adults should exercise for at least 150 minutes each week. The exercise should increase your heart rate and make you sweat (moderate-intensity exercise).  Most adults should also do strengthening exercises at least twice a week. This is in addition to the moderate-intensity exercise.  Maintain a healthy weight  Body mass index (BMI) is a measurement that can be used to identify possible weight problems. It estimates body fat based on height and weight. Your health care provider can help determine your BMI and help you achieve or maintain a healthy weight.  For females 50 years of age and older:   A BMI below 18.5 is considered underweight.  A BMI of 18.5 to 24.9 is normal.  A BMI of 25 to 29.9 is considered overweight.  A BMI of 30 and above is considered obese.  Watch  levels of cholesterol and blood lipids  You should start having your blood tested for lipids and cholesterol at 60 years of age, then have this test every 5 years.  You may need to have your cholesterol levels checked more often if:  Your lipid or cholesterol levels are high.  You are older than 60 years of age.  You are at high risk for heart disease.  CANCER SCREENING   Lung Cancer  Lung cancer screening is recommended for adults 54-45 years old who are at high risk for lung cancer because of a history of smoking.  A yearly low-dose CT scan of the lungs is recommended for people who:  Currently smoke.  Have quit within the past 15 years.  Have at least a 30-pack-year history of smoking. A pack year is smoking an average of one pack of cigarettes a day for 1 year.  Yearly screening should continue until it has been 15 years since you quit.  Yearly screening should stop if you develop a health problem that would prevent you from having lung cancer treatment.  Breast Cancer  Practice breast self-awareness. This means understanding how your breasts normally appear and feel.  It also means doing regular breast self-exams. Let your health care provider know about any changes, no matter how small.  If you are in your 20s or 30s, you should have a clinical breast exam (CBE) by a health care provider every 1-3 years as part  of a regular health exam.  If you are 40 or older, have a CBE every year. Also consider having a breast X-ray (mammogram) every year.  If you have a family history of breast cancer, talk to your health care provider about genetic screening.  If you are at high risk for breast cancer, talk to your health care provider about having an MRI and a mammogram every year.  Breast cancer gene (BRCA) assessment is recommended for women who have family members with BRCA-related cancers. BRCA-related cancers include:  Breast.  Ovarian.  Tubal.  Peritoneal  cancers.  Results of the assessment will determine the need for genetic counseling and BRCA1 and BRCA2 testing. Cervical Cancer Routine pelvic examinations to screen for cervical cancer are no longer recommended for nonpregnant women who are considered low risk for cancer of the pelvic organs (ovaries, uterus, and vagina) and who do not have symptoms. A pelvic examination may be necessary if you have symptoms including those associated with pelvic infections. Ask your health care provider if a screening pelvic exam is right for you.   The Pap test is the screening test for cervical cancer for women who are considered at risk.  If you had a hysterectomy for a problem that was not cancer or a condition that could lead to cancer, then you no longer need Pap tests.  If you are older than 65 years, and you have had normal Pap tests for the past 10 years, you no longer need to have Pap tests.  If you have had past treatment for cervical cancer or a condition that could lead to cancer, you need Pap tests and screening for cancer for at least 20 years after your treatment.  If you no longer get a Pap test, assess your risk factors if they change (such as having a new sexual partner). This can affect whether you should start being screened again.  Some women have medical problems that increase their chance of getting cervical cancer. If this is the case for you, your health care provider may recommend more frequent screening and Pap tests.  The human papillomavirus (HPV) test is another test that may be used for cervical cancer screening. The HPV test looks for the virus that can cause cell changes in the cervix. The cells collected during the Pap test can be tested for HPV.  The HPV test can be used to screen women 30 years of age and older. Getting tested for HPV can extend the interval between normal Pap tests from three to five years.  An HPV test also should be used to screen women of any age who  have unclear Pap test results.  After 60 years of age, women should have HPV testing as often as Pap tests.  Colorectal Cancer  This type of cancer can be detected and often prevented.  Routine colorectal cancer screening usually begins at 60 years of age and continues through 60 years of age.  Your health care provider may recommend screening at an earlier age if you have risk factors for colon cancer.  Your health care provider may also recommend using home test kits to check for hidden blood in the stool.  A small camera at the end of a tube can be used to examine your colon directly (sigmoidoscopy or colonoscopy). This is done to check for the earliest forms of colorectal cancer.  Routine screening usually begins at age 50.  Direct examination of the colon should be repeated every 5-10 years   through 60 years of age. However, you may need to be screened more often if early forms of precancerous polyps or small growths are found. Skin Cancer  Check your skin from head to toe regularly.  Tell your health care provider about any new moles or changes in moles, especially if there is a change in a mole's shape or color.  Also tell your health care provider if you have a mole that is larger than the size of a pencil eraser.  Always use sunscreen. Apply sunscreen liberally and repeatedly throughout the day.  Protect yourself by wearing long sleeves, pants, a wide-brimmed hat, and sunglasses whenever you are outside. HEART DISEASE, DIABETES, AND HIGH BLOOD PRESSURE   Have your blood pressure checked at least every 1-2 years. High blood pressure causes heart disease and increases the risk of stroke.  If you are between 55 years and 79 years old, ask your health care provider if you should take aspirin to prevent strokes.  Have regular diabetes screenings. This involves taking a blood sample to check your fasting blood sugar level.  If you are at a normal weight and have a low risk for  diabetes, have this test once every three years after 60 years of age.  If you are overweight and have a high risk for diabetes, consider being tested at a younger age or more often. PREVENTING INFECTION  Hepatitis B  If you have a higher risk for hepatitis B, you should be screened for this virus. You are considered at high risk for hepatitis B if:  You were born in a country where hepatitis B is common. Ask your health care provider which countries are considered high risk.  Your parents were born in a high-risk country, and you have not been immunized against hepatitis B (hepatitis B vaccine).  You have HIV or AIDS.  You use needles to inject street drugs.  You live with someone who has hepatitis B.  You have had sex with someone who has hepatitis B.  You get hemodialysis treatment.  You take certain medicines for conditions, including cancer, organ transplantation, and autoimmune conditions. Hepatitis C  Blood testing is recommended for:  Everyone born from 1945 through 1965.  Anyone with known risk factors for hepatitis C. Sexually transmitted infections (STIs)  You should be screened for sexually transmitted infections (STIs) including gonorrhea and chlamydia if:  You are sexually active and are younger than 60 years of age.  You are older than 60 years of age and your health care provider tells you that you are at risk for this type of infection.  Your sexual activity has changed since you were last screened and you are at an increased risk for chlamydia or gonorrhea. Ask your health care provider if you are at risk.  If you do not have HIV, but are at risk, it may be recommended that you take a prescription medicine daily to prevent HIV infection. This is called pre-exposure prophylaxis (PrEP). You are considered at risk if:  You are sexually active and do not regularly use condoms or know the HIV status of your partner(s).  You take drugs by injection.  You are  sexually active with a partner who has HIV. Talk with your health care provider about whether you are at high risk of being infected with HIV. If you choose to begin PrEP, you should first be tested for HIV. You should then be tested every 3 months for as long as you are taking   PrEP.  PREGNANCY   If you are premenopausal and you may become pregnant, ask your health care provider about preconception counseling.  If you may become pregnant, take 400 to 800 micrograms (mcg) of folic acid every day.  If you want to prevent pregnancy, talk to your health care provider about birth control (contraception). OSTEOPOROSIS AND MENOPAUSE   Osteoporosis is a disease in which the bones lose minerals and strength with aging. This can result in serious bone fractures. Your risk for osteoporosis can be identified using a bone density scan.  If you are 65 years of age or older, or if you are at risk for osteoporosis and fractures, ask your health care provider if you should be screened.  Ask your health care provider whether you should take a calcium or vitamin D supplement to lower your risk for osteoporosis.  Menopause may have certain physical symptoms and risks.  Hormone replacement therapy may reduce some of these symptoms and risks. Talk to your health care provider about whether hormone replacement therapy is right for you.  HOME CARE INSTRUCTIONS   Schedule regular health, dental, and eye exams.  Stay current with your immunizations.   Do not use any tobacco products including cigarettes, chewing tobacco, or electronic cigarettes.  If you are pregnant, do not drink alcohol.  If you are breastfeeding, limit how much and how often you drink alcohol.  Limit alcohol intake to no more than 1 drink per day for nonpregnant women. One drink equals 12 ounces of beer, 5 ounces of wine, or 1 ounces of hard liquor.  Do not use street drugs.  Do not share needles.  Ask your health care provider for  help if you need support or information about quitting drugs.  Tell your health care provider if you often feel depressed.  Tell your health care provider if you have ever been abused or do not feel safe at home. Document Released: 10/23/2010 Document Revised: 08/24/2013 Document Reviewed: 03/11/2013 ExitCare Patient Information 2015 [employer]. This information is not intended to replace advice given to you by your health care provider. Make sure you discuss any questions you have with your health care provider.  

## 2014-04-08 NOTE — Telephone Encounter (Signed)
Can you add a b12?

## 2014-04-09 LAB — HEPATITIS C ANTIBODY: HCV AB: NEGATIVE

## 2014-04-10 DIAGNOSIS — Z0000 Encounter for general adult medical examination without abnormal findings: Secondary | ICD-10-CM | POA: Insufficient documentation

## 2014-04-10 NOTE — Assessment & Plan Note (Signed)

## 2014-04-11 ENCOUNTER — Encounter: Payer: Self-pay | Admitting: Internal Medicine

## 2014-04-15 ENCOUNTER — Encounter: Payer: Self-pay | Admitting: Internal Medicine

## 2014-04-15 DIAGNOSIS — Z789 Other specified health status: Secondary | ICD-10-CM | POA: Insufficient documentation

## 2014-04-15 DIAGNOSIS — E7849 Other hyperlipidemia: Secondary | ICD-10-CM | POA: Insufficient documentation

## 2014-05-12 ENCOUNTER — Ambulatory Visit: Payer: Self-pay | Admitting: Internal Medicine

## 2014-05-12 LAB — HM MAMMOGRAPHY: HM Mammogram: NEGATIVE

## 2014-09-06 ENCOUNTER — Telehealth: Payer: Self-pay | Admitting: *Deleted

## 2014-09-06 NOTE — Telephone Encounter (Signed)
Please schedule her an appointment, need appointment for antibiotics. Thanks!

## 2014-09-06 NOTE — Telephone Encounter (Signed)
Pt called back. Scheduled appt with [facility] 09/07/14 @ 1:30pm

## 2014-09-06 NOTE — Telephone Encounter (Signed)
Pt notified. Pt refused to schedule appt. Pt states she will go to a walk in clinic

## 2014-09-07 ENCOUNTER — Ambulatory Visit (INDEPENDENT_AMBULATORY_CARE_PROVIDER_SITE_OTHER): Payer: 59 | Admitting: Nurse Practitioner

## 2014-09-07 VITALS — BP 138/84 | HR 81 | Temp 98.3°F | Resp 16 | Ht 67.0 in | Wt 179.8 lb

## 2014-09-07 DIAGNOSIS — J069 Acute upper respiratory infection, unspecified: Secondary | ICD-10-CM | POA: Diagnosis not present

## 2014-09-07 MED ORDER — HYDROCOD POLST-CPM POLST ER 10-8 MG/5ML PO SUER: 5.0000 mL | Freq: Every evening | ORAL | Status: DC | PRN

## 2014-09-07 MED ORDER — AZITHROMYCIN 250 MG PO TABS: ORAL_TABLET | ORAL | Status: DC

## 2014-09-07 NOTE — Progress Notes (Signed)
   Subjective:    Patient ID: Mikayla Cuevas, female    DOB: 14-Dec-1955, 59 y.o.   MRN: [id number]  HPI  Ms. Drozdowski is a 59 yo female with a CC of cold symptoms x 8 days  1)  Started as nasal congestions,sneezing, sinus pressure-maxillary   double sickening  Lots postnasal drip, highest 100.9 yesterday  Zyrtec          Naproxen  Tylenol   Review of Systems  Constitutional: Positive for fever, chills, diaphoresis and fatigue.  HENT: Positive for ear pain and postnasal drip. Negative for sinus pressure, sneezing and sore throat.   Respiratory: Positive for cough. Negative for chest tightness, shortness of breath and wheezing.   Cardiovascular: Negative for chest pain, palpitations and leg swelling.  Gastrointestinal: Negative for nausea, vomiting and diarrhea.  Skin: Negative for rash.  Neurological: Positive for headaches. Negative for dizziness, weakness and numbness.  Psychiatric/Behavioral: The patient is not nervous/anxious.       Objective:   Physical Exam  Constitutional: She is oriented to person, place, and time. She appears well-developed and well-nourished. No distress.  BP 138/84 mmHg  Pulse 81  Temp(Src) 98.3 F (36.8 C)  Resp 16  Ht 5\' 7"  (1.702 m)  Wt 179 lb 12.8 oz (81.557 kg)  BMI 28.15 kg/m2  SpO2 97%   HENT:  Head: Normocephalic and atraumatic.  Right Ear: External ear normal.  Left Ear: External ear normal.  Cardiovascular: Normal rate, regular rhythm and normal heart sounds.  Exam reveals no gallop and no friction rub.   No murmur heard. Pulmonary/Chest: Effort normal and breath sounds normal. No respiratory distress. She has no wheezes. She has no rales. She exhibits no tenderness.  Neurological: She is alert and oriented to person, place, and time. No cranial nerve deficit. She exhibits normal muscle tone. Coordination normal.  Skin: Skin is warm and dry. No rash noted. She is not diaphoretic.  Psychiatric: She has a normal mood and affect. Her  behavior is normal. Judgment and thought content normal.      Assessment & Plan:

## 2014-09-07 NOTE — Patient Instructions (Signed)
Please take a probiotic ( Align, Floraque or Culturelle) while you are on the antibiotic to prevent a serious antibiotic associated diarrhea  Called clostirudium dificile colitis and a vaginal yeast infection.   Call us if not improved with the medications.

## 2014-09-07 NOTE — Progress Notes (Signed)
Pre visit review using our clinic review tool, if applicable. No additional management support is needed unless otherwise documented below in the visit note. 

## 2014-09-21 ENCOUNTER — Encounter: Payer: Self-pay | Admitting: Nurse Practitioner

## 2014-09-21 DIAGNOSIS — J069 Acute upper respiratory infection, unspecified: Secondary | ICD-10-CM | POA: Insufficient documentation

## 2014-09-21 NOTE — Assessment & Plan Note (Signed)
Z-pack to pharmacy. Continue OTC methods. Benadryl at night. FU prn worsening/failure to improve.

## 2015-01-03 ENCOUNTER — Encounter: Payer: Self-pay | Admitting: Internal Medicine

## 2015-01-03 ENCOUNTER — Ambulatory Visit (INDEPENDENT_AMBULATORY_CARE_PROVIDER_SITE_OTHER): Payer: 59 | Admitting: Internal Medicine

## 2015-01-03 VITALS — BP 148/98 | HR 89 | Temp 98.0°F | Resp 14 | Ht 67.0 in | Wt 178.2 lb

## 2015-01-03 DIAGNOSIS — Z634 Disappearance and death of family member: Secondary | ICD-10-CM | POA: Diagnosis not present

## 2015-01-03 DIAGNOSIS — F4321 Adjustment disorder with depressed mood: Secondary | ICD-10-CM

## 2015-01-03 MED ORDER — ALPRAZOLAM 0.25 MG PO TABS: 0.2500 mg | ORAL_TABLET | Freq: Two times a day (BID) | ORAL | Status: DC | PRN

## 2015-01-03 MED ORDER — TRAZODONE HCL 50 MG PO TABS: 25.0000 mg | ORAL_TABLET | Freq: Every evening | ORAL | Status: DC | PRN
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
## 2015-01-03 NOTE — Progress Notes (Signed)
Pre-visit discussion using our clinic review tool. No additional management support is needed unless otherwise documented below in the visit note.  
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
## 2015-01-17 ENCOUNTER — Ambulatory Visit (INDEPENDENT_AMBULATORY_CARE_PROVIDER_SITE_OTHER): Payer: 59 | Admitting: Internal Medicine

## 2015-01-17 ENCOUNTER — Encounter: Payer: Self-pay | Admitting: Internal Medicine

## 2015-01-17 VITALS — BP 138/80 | HR 67 | Temp 98.1°F | Resp 12 | Ht 67.0 in | Wt 176.4 lb

## 2015-01-17 DIAGNOSIS — M130 Polyarthritis, unspecified: Secondary | ICD-10-CM | POA: Diagnosis not present

## 2015-01-17 DIAGNOSIS — Z634 Disappearance and death of family member: Secondary | ICD-10-CM

## 2015-01-17 DIAGNOSIS — M609 Myositis, unspecified: Secondary | ICD-10-CM | POA: Diagnosis not present

## 2015-01-17 DIAGNOSIS — F4321 Adjustment disorder with depressed mood: Secondary | ICD-10-CM | POA: Diagnosis not present

## 2015-01-17 DIAGNOSIS — E785 Hyperlipidemia, unspecified: Secondary | ICD-10-CM

## 2015-01-17 DIAGNOSIS — M791 Myalgia: Secondary | ICD-10-CM | POA: Diagnosis not present

## 2015-01-17 DIAGNOSIS — E7849 Other hyperlipidemia: Secondary | ICD-10-CM

## 2015-01-17 DIAGNOSIS — IMO0001 Reserved for inherently not codable concepts without codable children: Secondary | ICD-10-CM | POA: Insufficient documentation

## 2015-01-17 LAB — LIPID PANEL
Cholesterol: 284 mg/dL — ABNORMAL HIGH (ref 0–200)
HDL: 44.1 mg/dL (ref >39.00)
LDL CALC: 212 mg/dL — AB (ref 0–99)
NONHDL: 239.51
TRIGLYCERIDES: 137 mg/dL (ref 0.0–149.0)
Total CHOL/HDL Ratio: 6
VLDL: 27.4 mg/dL (ref 0.0–40.0)

## 2015-01-17 LAB — C-REACTIVE PROTEIN: CRP: 0.3 mg/dL — AB (ref 0.5–20.0)

## 2015-01-17 LAB — SEDIMENTATION RATE: Sed Rate: 19 mm/hr (ref 0–22)

## 2015-01-17 LAB — CK: Total CK: 151 U/L (ref 7–177)

## 2015-01-17 LAB — TSH: TSH: 1.2 u[IU]/mL (ref 0.35–4.50)

## 2015-01-17 LAB — RHEUMATOID FACTOR

## 2015-01-17 NOTE — Progress Notes (Signed)
+  Subjective:  Patient ID: Mikayla Cuevas, female    DOB: 18-May-1955  Age: 58 y.o. MRN: [id number]  CC: The primary encounter diagnosis was Polyarthritis. Diagnoses of Myalgia and myositis, Hyperlipidemia, Bereavement due to life event, and Familial hyperlipidemia were also pertinent to this visit.  HPI Mikayla Cuevas presents for 2 week follow up on severe complicated grief following the death of her mother in 10/14/22.  Was already taking effexor XR 300 mg daily but was having recurrent episodes of panic and persistent insomnia, so trazodone for insomnia  Was prescribed and prn alprazolam.  She continues to have trouble initaitng sleep, so she ends up taking  Alprazolam an hour later and feels hung over the flolowing morning  until around noon .  She continues to report  generalized body aches that started after mother died, and wonders if it has anything to do with an outbreak  Of " boils" that occurred on her inner thigh, buttocks and left thorax that she treated with salt water  soaks with resolution, all but one that recurred repeatedly non her thigh until she incised it.  She did onto express any pus,  Just clotted blood. She was not evaluated by physician during this period    Body aches reminiscent of statin use ,  Transient response to naproxen. Aggravated by inactivitiy.  Dexscribign myalgias but also reports pain in shoulders, hips, ankles and elbows.    Her GERD aggravated by fish oil if she takes > 1000 mg daily for hyperlipidemia.  She is requesting my opinion on a new MVI that she has read about "Mikayla Cuevas" that is supposed to lower cholesterol and improve bowel motility,  Fruit based.    Outpatient Prescriptions Prior to Visit  Medication Sig Dispense Refill  . acetaminophen (TYLENOL) 325 MG tablet Take 325 mg by mouth every 6 (six) hours as needed.    . ALPRAZolam (XANAX) 0.25 MG tablet Take 1 tablet (0.25 mg total) by mouth 2 (two) times daily as needed for anxiety. 60 tablet 0  .  aspirin 81 MG tablet Take 81 mg by mouth daily.    . Calcium Carb-Cholecalciferol (CALCIUM + D3 PO) Take 1 tablet by mouth daily.    . cetirizine (ZYRTEC) 10 MG tablet Take 10 mg by mouth daily.    . Cholecalciferol (VITAMIN D3) 2000 UNITS TABS Take 1 tablet by mouth daily.    . clobetasol cream (TEMOVATE) 0.05 % Apply topically 2 (two) times daily. 30 g 1  . Cyanocobalamin (VITAMIN B-12 PO) Take 2,500 mcg by mouth daily.    Marland Kitchen NAPROXEN PO Take 1 tablet by mouth as needed.    . Omega-3 1000 MG CAPS Take 1 capsule twice daily 60 capsule 0  . omeprazole (PRILOSEC) 40 MG capsule Take 1 capsule (40 mg total) by mouth daily. 90 capsule 3  . triamcinolone cream (KENALOG) 0.1 % Apply 1 application topically 2 (two) times daily. 30 g 1  . Venlafaxine HCl 150 MG TB24 Take 1 tablet (150 mg total) by mouth 2 (two) times daily. 180 each 3  . traZODone (DESYREL) 50 MG tablet Take 0.5-1 tablets (25-50 mg total) by mouth at bedtime as needed for sleep. (Patient not taking: Reported on 01/17/2015) 30 tablet 3   No facility-administered medications prior to visit.    Review of Systems;  Patient denies headache, fevers, malaise, unintentional weight loss, skin rash, eye pain, sinus congestion and sinus pain, sore throat, dysphagia,  hemoptysis , cough, dyspnea, wheezing, chest  pain, palpitations, orthopnea, edema, abdominal pain, nausea, melena, diarrhea, constipation, flank pain, dysuria, hematuria, urinary  Frequency, nocturia, numbness, tingling, seizures,  Focal weakness, Loss of consciousness,  Tremor, insomnia, depression, anxiety, and suicidal ideation.      Objective:  BP 138/80 mmHg  Pulse 67  Temp(Src) 98.1 F (36.7 C) (Oral)  Resp 12  Ht 5\' 7"  (1.702 m)  Wt 176 lb 6 oz (80.003 kg)  BMI 27.62 kg/m2  SpO2 96%  BP Readings from Last 3 Encounters:  01/17/15 138/80  01/03/15 148/98  09/07/14 138/84    Wt Readings from Last 3 Encounters:  01/17/15 176 lb 6 oz (80.003 kg)  01/03/15 178 lb 4  oz (80.854 kg)  09/07/14 179 lb 12.8 oz (81.557 kg)    General appearance: alert, cooperative and appears stated age Neck: no adenopathy, no carotid bruit, supple, symmetrical, trachea midline and thyroid not enlarged, symmetric, no tenderness/mass/nodules Back: symmetric, no curvature. ROM normal. No CVA tenderness. Lungs: clear to auscultation bilaterally Heart: regular rate and rhythm, S1, S2 normal, no murmur, click, rub or gallop Pulses: 2+ and symmetric Skin: Skin color, texture, turgor normal. No rashes or lesions MSK: no synovitis, bursitis in any affected joints Lymph nodes: Cervical, supraclavicular, and axillary nodes normal.  No results found for: HGBA1C  Lab Results  Component Value Date   CREATININE 0.9 04/08/2014   CREATININE 0.8 10/28/2013   CREATININE 0.85 03/16/2013    Lab Results  Component Value Date   WBC 8.9 04/08/2014   HGB 12.8 04/08/2014   HCT 40.2 04/08/2014   PLT 303.0 04/08/2014   GLUCOSE 91 04/08/2014   CHOL 295* 04/08/2014   TRIG 218.0* 04/08/2014   HDL 41.20 04/08/2014   LDLDIRECT 222.9 04/08/2014   LDLCALC 192* 10/28/2013   ALT 16 04/08/2014   AST 17 04/08/2014   NA 139 04/08/2014   K 4.0 04/08/2014   CL 102 04/08/2014   CREATININE 0.9 04/08/2014   BUN 16 04/08/2014   CO2 26 04/08/2014   TSH 1.48 04/08/2014    No results found.  Assessment & Plan:   Problem List Items Addressed This Visit    Familial hyperlipidemia    Not tolerating fish oil.  History of statin intolerance.  Rechecking today.    Lab Results  Component Value Date   CHOL 295* 04/08/2014   HDL 41.20 04/08/2014   LDLCALC 192* 10/28/2013   LDLDIRECT 222.9 04/08/2014   TRIG 218.0* 04/08/2014   CHOLHDL 7 04/08/2014         Bereavement due to life event    Improved demeanor today,  No longer crying uncontrollably.  Still endorsing symptoms of depression including insomnia and anxiety.  Has appt with Dr Mikayla Cuevas this week,.  Continue trazodone but advised to take  it at 8 PM, can still take alprazolam at 9 Pm if able to sleep.       Polyarthritis - Primary    rheumatologic workup underway      Relevant Orders   Rheumatoid factor   C-reactive protein   Sedimentation rate   ANA w/Reflex if Positive   Hepatitis C antibody   Myalgia and myositis    Checking thyroid and CK      Relevant Orders   TSH   CK    Other Visit Diagnoses    Hyperlipidemia        Relevant Orders    Lipid panel       I am having Mikayla Cuevas maintain her aspirin, Calcium Carb-Cholecalciferol (CALCIUM +  D3 PO), Vitamin D3, Cyanocobalamin (VITAMIN B-12 PO), Omega-3, Venlafaxine HCl, omeprazole, triamcinolone cream, clobetasol cream, acetaminophen, cetirizine, NAPROXEN PO, ALPRAZolam, and traZODone.  No orders of the defined types were placed in this encounter.    There are no discontinued medications.  Follow-up: No Follow-up on file.   Crecencio Mc, MD

## 2015-01-17 NOTE — Assessment & Plan Note (Signed)
rheumatologic workup underway

## 2015-01-17 NOTE — Assessment & Plan Note (Signed)
Checking thyroid and CK

## 2015-01-17 NOTE — Assessment & Plan Note (Signed)
Improved demeanor today,  No longer crying uncontrollably.  Still endorsing symptoms of depression including insomnia and anxiety.  Has appt with Dr Nicolasa Ducking this week,.  Continue trazodone but advised to take it at 8 PM, can still take alprazolam at 9 Pm if able to sleep.

## 2015-01-17 NOTE — Progress Notes (Signed)
Pre-visit discussion using our clinic review tool. No additional management support is needed unless otherwise documented below in the visit note.  

## 2015-01-17 NOTE — Patient Instructions (Signed)
Continue 50 mg trazodone but take it earlier,  At 8 PM.    You can stop the fish oil since you are not tolerating it  Workup for joint and muscle pain is in process.  If everything is normal,  Start a walking program or a light aerobics program to improve your muscle tone, which may help.   Baseline cholesterol today ; you can start the [place] vitamin you mentioned

## 2015-01-17 NOTE — Assessment & Plan Note (Signed)
Not tolerating fish oil.  History of statin intolerance.  Rechecking today.    Lab Results  Component Value Date   CHOL 295* 04/08/2014   HDL 41.20 04/08/2014   LDLCALC 192* 10/28/2013   LDLDIRECT 222.9 04/08/2014   TRIG 218.0* 04/08/2014   CHOLHDL 7 04/08/2014

## 2015-01-18 LAB — HEPATITIS C ANTIBODY: HCV AB: NEGATIVE

## 2015-01-18 LAB — ANA W/REFLEX IF POSITIVE: ANA: NEGATIVE

## 2015-01-19 ENCOUNTER — Encounter: Payer: Self-pay | Admitting: Internal Medicine

## 2015-01-20 ENCOUNTER — Ambulatory Visit (INDEPENDENT_AMBULATORY_CARE_PROVIDER_SITE_OTHER): Payer: 59 | Admitting: Licensed Clinical Social Worker

## 2015-01-20 DIAGNOSIS — F32A Depression, unspecified: Secondary | ICD-10-CM

## 2015-01-20 DIAGNOSIS — F329 Major depressive disorder, single episode, unspecified: Secondary | ICD-10-CM | POA: Diagnosis not present
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
## 2015-01-25 ENCOUNTER — Ambulatory Visit (INDEPENDENT_AMBULATORY_CARE_PROVIDER_SITE_OTHER): Payer: 59 | Admitting: Licensed Clinical Social Worker

## 2015-01-25 DIAGNOSIS — F329 Major depressive disorder, single episode, unspecified: Secondary | ICD-10-CM | POA: Diagnosis not present

## 2015-01-25 DIAGNOSIS — F32A Depression, unspecified: Secondary | ICD-10-CM

## 2015-01-27 NOTE — Progress Notes (Signed)
   THERAPIST PROGRESS NOTE  Session Time: 57min  Participation Level: Active  Behavioral Response: CasualAlertDepressed  Type of Therapy: Individual Therapy  Treatment Goals addressed: Coping  Interventions: CBT, Solution Focused and Reframing  Summary: Mikayla Cuevas is a 59 y.o. female who presents with continued symptoms of her diagnosis.  Mikayla Cuevas is currently overwhelmed with her co workers, hours at work, living situation and having to rearrange her schedule to pick up her Grandchildren.  Patient is utilizing coping skills taught and will continue to write in journal to assist with thoughts, feelings, and emotions.    Suicidal/Homicidal: Negativewithout intent/plan  Therapist Response: Writer challenged Patient's thoughts of symptoms.  Writer assisted Patient with listing coping skills and understanding her current symptoms.  Writer provided Patient with coping skills and how to journal.  Plan: Return again in 2 weeks.  Diagnosis: Axis I: Depressive Disorder    Axis II: No diagnosis    [name] 01/27/2015

## 2015-02-01 ENCOUNTER — Encounter: Payer: Self-pay | Admitting: Psychiatry

## 2015-02-01 ENCOUNTER — Ambulatory Visit (INDEPENDENT_AMBULATORY_CARE_PROVIDER_SITE_OTHER): Payer: 59 | Admitting: Psychiatry

## 2015-02-01 VITALS — BP 124/88 | HR 84 | Temp 97.5°F | Ht 67.0 in | Wt 177.4 lb

## 2015-02-01 DIAGNOSIS — Z634 Disappearance and death of family member: Secondary | ICD-10-CM | POA: Diagnosis not present

## 2015-02-01 DIAGNOSIS — F332 Major depressive disorder, recurrent severe without psychotic features: Secondary | ICD-10-CM

## 2015-02-01 MED ORDER — QUETIAPINE FUMARATE 50 MG PO TABS: ORAL_TABLET | ORAL | Status: DC
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
## 2015-02-08 ENCOUNTER — Ambulatory Visit (INDEPENDENT_AMBULATORY_CARE_PROVIDER_SITE_OTHER): Payer: 59 | Admitting: Licensed Clinical Social Worker

## 2015-02-08 DIAGNOSIS — F332 Major depressive disorder, recurrent severe without psychotic features: Secondary | ICD-10-CM | POA: Diagnosis not present

## 2015-02-11 NOTE — Progress Notes (Signed)
   THERAPIST PROGRESS NOTE  Session Time: 54  Participation Level: Active  Behavioral Response: CasualAlertAnxious  Type of Therapy: Individual Therapy  Treatment Goals addressed: Coping  Interventions: CBT, Solution Focused and Reframing  Summary: Mikayla Cuevas is a 59 y.o. female who presents with continued symptoms of her diagnosis.  She was able to list her current stressors.  Patient was able to list coping stratgies. Patient was open to role playing and learning positive stress relievers.  Suicidal/Homicidal: Nowithout intent/plan  Therapist Response: Assessed current mood and actively listened as she communicated her stressors.  She was able to list coping strategies that are useful.    Plan: Return again in 2 weeks.  Diagnosis: Axis I: Depression    Axis II: No diagnosis    Mikayla Cuevas 02/11/2015

## 2015-02-23 ENCOUNTER — Ambulatory Visit (INDEPENDENT_AMBULATORY_CARE_PROVIDER_SITE_OTHER): Payer: 59 | Admitting: Licensed Clinical Social Worker

## 2015-02-23 DIAGNOSIS — F332 Major depressive disorder, recurrent severe without psychotic features: Secondary | ICD-10-CM | POA: Diagnosis not present

## 2015-02-25 NOTE — Progress Notes (Signed)
   THERAPIST PROGRESS NOTE  Session Time: 64min  Participation Level: Active  Behavioral Response: CasualAlertDepressed  Type of Therapy: Individual Therapy  Treatment Goals addressed: Coping  Interventions: CBT, Motivational Interviewing and Solution Focused  Summary: Mikayla Cuevas is a 59 y.o. female who presents with continued symptoms of her diagnosis.  She was tearful throughout the session and discussed family life and her childhood.  She continues to grieve the loss of her mother. She is angry regarding her mother's preferences in her children and the differences that she displayed.  Patient feels like she never measured up to her mother's expectations.  She reports that she has good and bad days.  She reports that she completes various task in her mother's honor.  Suicidal/Homicidal: Nowithout intent/plan  Therapist Response: Assessed mood; actively listened as she listed her stressors.  Explored with patient her current symptoms and assisted with developing coping mechanisms.  Normalized her feelings.  Plan: Return again in 2 weeks.  Diagnosis: Axis I: Major Depression    Axis II: No diagnosis    [name] 02/25/2015

## 2015-03-01 ENCOUNTER — Ambulatory Visit: Payer: 59 | Admitting: Psychiatry

## 2015-03-09 ENCOUNTER — Ambulatory Visit (INDEPENDENT_AMBULATORY_CARE_PROVIDER_SITE_OTHER): Payer: 59 | Admitting: Licensed Clinical Social Worker

## 2015-03-09 DIAGNOSIS — F332 Major depressive disorder, recurrent severe without psychotic features: Secondary | ICD-10-CM

## 2015-03-14 NOTE — Progress Notes (Signed)
   THERAPIST PROGRESS NOTE  Session Time: 42min  Participation Level: Active  Behavioral Response: Casual and NeatAlertDepressed  Type of Therapy: Individual Therapy  Treatment Goals addressed: Coping  Interventions: CBT, Motivational Interviewing, Solution Focused, Supportive, Family Systems and Reframing  Summary: Mikayla Cuevas is a 58 y.o. female who presents with continued symptoms of her diagnosis.  She is currently attempting to plan holiday season for her and her family.  Her plans are to go to work to avoid dealing with her feelings.  She continues to be minimally social with peers and family members.  She and her family are currently planning a memorial service to remember her mother.  She continues to utilize coping skills taught.  Suicidal/Homicidal: Nowithout intent/plan  Therapist Response: LCSW provided Patient with ongoing emotional support and encouragement.  Normalized her feelings.  Commended Patient on her progress and reinforced the importance of client staying focused on her own strengths and resources and resiliency. Processed various strategies for dealing with stressors.    Plan: Return again in 2 weeks.  Diagnosis: Axis I: Major Depression, Recurrent severe    Axis II: No diagnosis    [name] 03/14/2015

## 2015-03-16 ENCOUNTER — Ambulatory Visit (INDEPENDENT_AMBULATORY_CARE_PROVIDER_SITE_OTHER): Payer: 59 | Admitting: Psychiatry

## 2015-03-16 ENCOUNTER — Encounter: Payer: Self-pay | Admitting: Psychiatry

## 2015-03-16 VITALS — BP 122/84 | HR 97 | Temp 97.9°F | Ht 67.0 in | Wt 176.8 lb

## 2015-03-16 DIAGNOSIS — F332 Major depressive disorder, recurrent severe without psychotic features: Secondary | ICD-10-CM | POA: Diagnosis not present

## 2015-03-16 DIAGNOSIS — Z634 Disappearance and death of family member: Secondary | ICD-10-CM

## 2015-03-16 MED ORDER — VENLAFAXINE HCL ER 150 MG PO CP24: 300.0000 mg | ORAL_CAPSULE | Freq: Every day | ORAL | Status: DC

## 2015-03-16 MED ORDER — BREXPIPRAZOLE 1 MG PO TABS: 1.0000 mg | ORAL_TABLET | ORAL | Status: DC

## 2015-03-16 MED ORDER — TRAZODONE HCL 50 MG PO TABS: ORAL_TABLET | ORAL | Status: DC

## 2015-03-16 NOTE — Progress Notes (Signed)
BH MD/PA/NP OP Progress Note  03/16/2015 11:07 AM Mikayla Cuevas  MRN:  [id number]  Subjective: Patient returns for follow-up of her major depressive disorder, recurrent severe without psychotic features. She indicates she did try the Seroquel will that were using for augmentation for depression. However she states that it caused her to have nightmares and so she has discontinued that medication. Otherwise her depression continues she has crying episodes about 2 times per week. States she continues to work. States her sleep continues to be poor. She had previously been given some trazodone from her primary care physician but really took 50 mg. She states that it made her somewhat sleepy but it still took her 2-3 hours of her she would fall asleep and then only sleep 2 or 3 hours.    Chief Complaint:  Chief Complaint    Follow-up; Medication Refill; Medication Problem     Visit Diagnosis:  No diagnosis found.  Past Medical History:  Past Medical History  Diagnosis Date  . Depression   . GERD (gastroesophageal reflux disease)   . Allergy   . Hyperlipidemia   . History of chicken pox   . History of colon polyps     Colonoscopy 2012 - repeat in 10 years - Dr. Vira Agar  . Migraine   . Diverticulitis   . Psoriasis     Clobetasol   . B12 deficiency   . Anxiety     Past Surgical History  Procedure Laterality Date  . Abdominal hysterectomy    . Cholecystectomy    . Tonsillectomy  1975  . Breast surgery     Family History:  Family History  Problem Relation Age of Onset  . Hyperlipidemia Mother   . Hypertension Mother   . Heart disease Mother   . Alcohol abuse Father   . Cancer Father     breast  . Hyperlipidemia Father   . Depression Father   . Cancer Sister     2 sisters with breast cancer  . Cancer Paternal Aunt     breast   . Hyperlipidemia Maternal Grandmother   . Hypertension Maternal Grandmother   . Alcohol abuse Brother   . Drug abuse Brother   . ADD / ADHD  Daughter   . Bipolar disorder Daughter   . Depression Daughter   . Breast cancer Sister   . Breast cancer Sister   . Alcohol abuse Brother   . Drug abuse Brother    Social History:  Social History   Social History  . Marital Status: Married    Spouse Name: N/A  . Number of Children: 2  . Years of Education: N/A   Occupational History  . ER Tech     [employer]   Social History Main Topics  . Smoking status: Former Smoker -- 1.00 packs/day for 35 years    Types: Cigarettes    Start date: 01/31/1966    Quit date: 02/01/1995  . Smokeless tobacco: Former Systems developer    Types: [place] date: 04/29/1989  . Alcohol Use: No  . Drug Use: No  . Sexual Activity: Not Currently   Other Topics Concern  . None   Social History Narrative   Hobbies: grandchildren, guitar   Caffeine: 1 cup daily   Exercise: Not at present   Diet: needs improvement   Additional History:   Assessment:   Musculoskeletal: Strength & Muscle Tone: within normal limits Gait & Station: normal Patient leans: N/A  Psychiatric Specialty Exam: HPI  Review of Systems  Psychiatric/Behavioral: Positive for depression. Negative for suicidal ideas, hallucinations, memory loss and substance abuse. The patient has insomnia. The patient is not nervous/anxious.   All other systems reviewed and are negative.   Blood pressure 122/84, pulse 97, temperature 97.9 F (36.6 C), temperature source Tympanic, height 5\' 7"  (1.702 m), weight 176 lb 12.8 oz (80.196 kg), SpO2 94 %.Body mass index is 27.68 kg/(m^2).  General Appearance: Well Groomed  Eye Contact:  Good  Speech:  Normal Rate  Volume:  Normal  Mood:  Okay  Affect:  Constricted  Thought Process:  Linear  Orientation:  Full (Time, Place, and Person)  Thought Content:  Negative  Suicidal Thoughts:  No  Homicidal Thoughts:  No  Memory:  Immediate;   Good Recent;   Good Remote;   Good  Judgement:  Good  Insight:  Good  Psychomotor Activity:  Negative   Concentration:  Good  Recall:  Good  Fund of Knowledge: Good  Language: Good  Akathisia:  Negative  Handed:  Right  AIMS (if indicated):  Done 02/01/15 normal  Assets:  Communication Skills Desire for Improvement Vocational/Educational  ADL's:  Intact  Cognition: WNL  Sleep:  poor   Is the patient at risk to self?  No. Has the patient been a risk to self in the past 6 months?  No. Has the patient been a risk to self within the distant past?  Yes.   Is the patient a risk to others?  No. Has the patient been a risk to others in the past 6 months?  No. Has the patient been a risk to others within the distant past?  No.  Current Medications: Current Outpatient Prescriptions  Medication Sig Dispense Refill  . acetaminophen (TYLENOL) 325 MG tablet Take 325 mg by mouth every 6 (six) hours as needed.    . ALPRAZolam (XANAX) 0.25 MG tablet Take 1 tablet (0.25 mg total) by mouth 2 (two) times daily as needed for anxiety. 60 tablet 0  . aspirin 81 MG tablet Take 81 mg by mouth daily.    . Calcium Carb-Cholecalciferol (CALCIUM + D3 PO) Take 1 tablet by mouth daily.    . cetirizine (ZYRTEC) 10 MG tablet Take 10 mg by mouth daily.    . Cholecalciferol (VITAMIN D3) 2000 UNITS TABS Take 1 tablet by mouth daily.    . clobetasol cream (TEMOVATE) 0.05 % Apply topically 2 (two) times daily. 30 g 1  . Cyanocobalamin (VITAMIN B-12 PO) Take 2,500 mcg by mouth daily.    Marland Kitchen NAPROXEN PO Take 1 tablet by mouth as needed.    . Omega-3 1000 MG CAPS Take 1 capsule twice daily 60 capsule 0  . omeprazole (PRILOSEC) 40 MG capsule Take 1 capsule (40 mg total) by mouth daily. 90 capsule 3  . traZODone (DESYREL) 50 MG tablet Take two to three tablets at bedtime as needed for sleep. 30 tablet 3  . triamcinolone cream (KENALOG) 0.1 % Apply 1 application topically 2 (two) times daily. 30 g 1  . venlafaxine XR (EFFEXOR-XR) 150 MG 24 hr capsule Take 2 capsules (300 mg total) by mouth daily. 180 capsule 1  .  Brexpiprazole (REXULTI) 1 MG TABS Take 1 mg by mouth every morning. 30 tablet 1   No current facility-administered medications for this visit.    Medical Decision Making:  Established Problem, Worsening (2), Review of Medication Regimen & Side Effects (2) and Review of New Medication or Change in Dosage (2)  Treatment  Plan Summary:Medication management and Plan Plan Major depressive disorder, severe without psychotic features-patient is largely been stable on her medication regimen since the late 90s. However she is noticed a worsening depression since September of this year. Her previous psychiatrist had her on Effexor 300 mg daily. We made an attempt to augment with Seroquel but she indicates she had side effects of nightmares. Thus we'll try augmentation with Rexulti. Risk and benefits of been discussing patient's able to consent. She does indicates she is going to have metabolic labs done with her primary care physician at an upcoming appointment in December. We will start [place] .5 mg for seven days, then 1 mg daily. She has been given a starter pack with 14 days of medication and has a prescription waiting for her at the pharmacy after she's assessed tolerability of the medicine.   Insomnia-patient arty has some trazodone however she only went to 50 mg. I've instructed her now that she can take 100 mg oral 150 mg at bedtime.  Patient will follow up in 1 month. She's been encouraged call any questions or concerns prior to her next appointment.  Faith Rogue 03/16/2015, 11:07 AM

## 2015-03-23 NOTE — Progress Notes (Signed)
Called spoke with Lizzy at [facility] discontinue medication on 03-16-15 pt states she could not take

## 2015-03-24 ENCOUNTER — Telehealth: Payer: Self-pay

## 2015-03-24 ENCOUNTER — Ambulatory Visit (INDEPENDENT_AMBULATORY_CARE_PROVIDER_SITE_OTHER): Payer: 59 | Admitting: Licensed Clinical Social Worker

## 2015-03-24 DIAGNOSIS — F32A Depression, unspecified: Secondary | ICD-10-CM

## 2015-03-24 DIAGNOSIS — F329 Major depressive disorder, single episode, unspecified: Secondary | ICD-10-CM | POA: Diagnosis not present

## 2015-03-24 NOTE — Telephone Encounter (Signed)
left another message for pt to call office back

## 2015-03-24 NOTE — Telephone Encounter (Signed)
left message to call office

## 2015-03-24 NOTE — Telephone Encounter (Signed)
pt seen nicole today. pt states that the rexulti is not working.  pt states she is crying more and that her emotions are all over the place.  One minute she is crying next she mad.

## 2015-03-25 NOTE — Telephone Encounter (Signed)
pt states that she threw the pill away, she doesnt want to take it.  She took it for 7 days. pt states that she has hard time with medications. [phone]

## 2015-03-30 NOTE — Progress Notes (Signed)
   THERAPIST PROGRESS NOTE  Session Time: 58min  Participation Level: Active  Behavioral Response: CasualAlertDepressed  Type of Therapy: Individual Therapy  Treatment Goals addressed: Coping  Interventions: CBT, Motivational Interviewing, Solution Focused, Supportive and Reframing  Summary: Mikayla Cuevas is a 59 y.o. female who presents with continued symptoms of her diagnosis.  She reports having an "okay" Thanksgiving due to her work schedule.  She reports that she prefers to remain busy to avoid thinking about her mother passing away and that her family is not close the past few years.  She stated that she was excited to visit with her daughter and [name] a few days prior to Thanksgiving.  She listed stressors and coping skills to ensure that she is able to function at home and work.   Suicidal/Homicidal: Nowithout intent/plan  Therapist Response: LCSW provided Patient with ongoing emotional support and encouragement.  Normalized her feelings.  Commended Patient on her progress and reinforced the importance of client staying focused on her own strengths and resources and resiliency. Processed various strategies for dealing with stressors.    Plan: Return again in 2 weeks.  Diagnosis: Axis I: Major Depression, Recurrent severe    Axis II: No diagnosis    [name] 03/30/2015

## 2015-04-04 ENCOUNTER — Encounter: Payer: Self-pay | Admitting: Internal Medicine

## 2015-04-04 ENCOUNTER — Ambulatory Visit (INDEPENDENT_AMBULATORY_CARE_PROVIDER_SITE_OTHER): Payer: 59 | Admitting: Internal Medicine

## 2015-04-04 VITALS — BP 140/82 | HR 72 | Temp 97.9°F | Resp 12 | Ht 67.0 in | Wt 174.5 lb

## 2015-04-04 DIAGNOSIS — Z803 Family history of malignant neoplasm of breast: Secondary | ICD-10-CM

## 2015-04-04 DIAGNOSIS — Z0000 Encounter for general adult medical examination without abnormal findings: Secondary | ICD-10-CM | POA: Diagnosis not present

## 2015-04-04 DIAGNOSIS — Z1239 Encounter for other screening for malignant neoplasm of breast: Secondary | ICD-10-CM

## 2015-04-04 DIAGNOSIS — E7849 Other hyperlipidemia: Secondary | ICD-10-CM

## 2015-04-04 MED ORDER — ONDANSETRON HCL 4 MG PO TABS: 4.0000 mg | ORAL_TABLET | Freq: Three times a day (TID) | ORAL | Status: DC | PRN

## 2015-04-04 MED ORDER — RED YEAST RICE 600 MG PO CAPS: 1.0000 | ORAL_CAPSULE | Freq: Two times a day (BID) | ORAL | Status: DC

## 2015-04-04 NOTE — Patient Instructions (Addendum)
The natural remedies for cholesterol have not been proven to reduce your risk for a heart attack.  Red Yeast Rice has not been proven either,  But it does lower cholesterol, so if you want to try it , the dose is 600 mg twice daily in capsule form, available OTC.   We will get your 3D mammogram in January   Menopause is a normal process in which your reproductive ability comes to an end. This process happens gradually over a span of months to years, usually between the ages of 68 and 20. Menopause is complete when you have missed 12 consecutive menstrual periods. It is important to talk with your health care provider about some of the most common conditions that affect postmenopausal women, such as heart disease, cancer, and bone loss (osteoporosis). Adopting a healthy lifestyle and getting preventive care can help to promote your health and wellness. Those actions can also lower your chances of developing some of these common conditions. WHAT SHOULD I KNOW ABOUT MENOPAUSE? During menopause, you may experience a number of symptoms, such as:  Moderate-to-severe hot flashes.  Night sweats.  Decrease in sex drive.  Mood swings.  Headaches.  Tiredness.  Irritability.  Memory problems.  Insomnia. Choosing to treat or not to treat menopausal changes is an individual decision that you make with your health care provider. WHAT SHOULD I KNOW ABOUT HORMONE REPLACEMENT THERAPY AND SUPPLEMENTS? Hormone therapy products are effective for treating symptoms that are associated with menopause, such as hot flashes and night sweats. Hormone replacement carries certain risks, especially as you become older. If you are thinking about using estrogen or estrogen with progestin treatments, discuss the benefits and risks with your health care provider. WHAT SHOULD I KNOW ABOUT HEART DISEASE AND STROKE? Heart disease, heart attack, and stroke become more likely as you age. This may be due, in part, to the  hormonal changes that your body experiences during menopause. These can affect how your body processes dietary fats, triglycerides, and cholesterol. Heart attack and stroke are both medical emergencies. There are many things that you can do to help prevent heart disease and stroke:  Have your blood pressure checked at least every 1-2 years. High blood pressure causes heart disease and increases the risk of stroke.  If you are 62-61 years old, ask your health care provider if you should take aspirin to prevent a heart attack or a stroke.  Do not use any tobacco products, including cigarettes, chewing tobacco, or electronic cigarettes. If you need help quitting, ask your health care provider.  It is important to eat a healthy diet and maintain a healthy weight.  Be sure to include plenty of vegetables, fruits, low-fat dairy products, and lean protein.  Avoid eating foods that are high in solid fats, added sugars, or salt (sodium).  Get regular exercise. This is one of the most important things that you can do for your health.  Try to exercise for at least 150 minutes each week. The type of exercise that you do should increase your heart rate and make you sweat. This is known as moderate-intensity exercise.  Try to do strengthening exercises at least twice each week. Do these in addition to the moderate-intensity exercise.  Know your numbers.Ask your health care provider to check your cholesterol and your blood glucose. Continue to have your blood tested as directed by your health care provider. WHAT SHOULD I KNOW ABOUT CANCER SCREENING? There are several types of cancer. Take the following steps  to reduce your risk and to catch any cancer development as early as possible. Breast Cancer  Practice breast self-awareness.  This means understanding how your breasts normally appear and feel.  It also means doing regular breast self-exams. Let your health care provider know about any changes,  no matter how small.  If you are 76 or older, have a clinician do a breast exam (clinical breast exam or CBE) every year. Depending on your age, family history, and medical history, it may be recommended that you also have a yearly breast X-ray (mammogram).  If you have a family history of breast cancer, talk with your health care provider about genetic screening.  If you are at high risk for breast cancer, talk with your health care provider about having an MRI and a mammogram every year.  Breast cancer (BRCA) gene test is recommended for women who have family members with BRCA-related cancers. Results of the assessment will determine the need for genetic counseling and BRCA1 and for BRCA2 testing. BRCA-related cancers include these types:  Breast. This occurs in males or females.  Ovarian.  Tubal. This may also be called fallopian tube cancer.  Cancer of the abdominal or pelvic lining (peritoneal cancer).  Prostate.  Pancreatic. Cervical, Uterine, and Ovarian Cancer Your health care provider may recommend that you be screened regularly for cancer of the pelvic organs. These include your ovaries, uterus, and vagina. This screening involves a pelvic exam, which includes checking for microscopic changes to the surface of your cervix (Pap test).  For women ages 21-65, health care providers may recommend a pelvic exam and a Pap test every three years. For women ages 31-65, they may recommend the Pap test and pelvic exam, combined with testing for human papilloma virus (HPV), every five years. Some types of HPV increase your risk of cervical cancer. Testing for HPV may also be done on women of any age who have unclear Pap test results.  Other health care providers may not recommend any screening for nonpregnant women who are considered low risk for pelvic cancer and have no symptoms. Ask your health care provider if a screening pelvic exam is right for you.  If you have had past treatment for  cervical cancer or a condition that could lead to cancer, you need Pap tests and screening for cancer for at least 20 years after your treatment. If Pap tests have been discontinued for you, your risk factors (such as having a new sexual partner) need to be reassessed to determine if you should start having screenings again. Some women have medical problems that increase the chance of getting cervical cancer. In these cases, your health care provider may recommend that you have screening and Pap tests more often.  If you have a family history of uterine cancer or ovarian cancer, talk with your health care provider about genetic screening.  If you have vaginal bleeding after reaching menopause, tell your health care provider.  There are currently no reliable tests available to screen for ovarian cancer. Lung Cancer Lung cancer screening is recommended for adults 26-35 years old who are at high risk for lung cancer because of a history of smoking. A yearly low-dose CT scan of the lungs is recommended if you:  Currently smoke.  Have a history of at least 30 pack-years of smoking and you currently smoke or have quit within the past 15 years. A pack-year is smoking an average of one pack of cigarettes per day for one year. Yearly  screening should:  Continue until it has been 15 years since you quit.  Stop if you develop a health problem that would prevent you from having lung cancer treatment. Colorectal Cancer  This type of cancer can be detected and can often be prevented.  Routine colorectal cancer screening usually begins at age 52 and continues through age 31.  If you have risk factors for colon cancer, your health care provider may recommend that you be screened at an earlier age.  If you have a family history of colorectal cancer, talk with your health care provider about genetic screening.  Your health care provider may also recommend using home test kits to check for hidden blood in  your stool.  A small camera at the end of a tube can be used to examine your colon directly (sigmoidoscopy or colonoscopy). This is done to check for the earliest forms of colorectal cancer.  Direct examination of the colon should be repeated every 5-10 years until age 30. However, if early forms of precancerous polyps or small growths are found or if you have a family history or genetic risk for colorectal cancer, you may need to be screened more often. Skin Cancer  Check your skin from head to toe regularly.  Monitor any moles. Be sure to tell your health care provider:  About any new moles or changes in moles, especially if there is a change in a mole's shape or color.  If you have a mole that is larger than the size of a pencil eraser.  If any of your family members has a history of skin cancer, especially at a young age, talk with your health care provider about genetic screening.  Always use sunscreen. Apply sunscreen liberally and repeatedly throughout the day.  Whenever you are outside, protect yourself by wearing long sleeves, pants, a wide-brimmed hat, and sunglasses. WHAT SHOULD I KNOW ABOUT OSTEOPOROSIS? Osteoporosis is a condition in which bone destruction happens more quickly than new bone creation. After menopause, you may be at an increased risk for osteoporosis. To help prevent osteoporosis or the bone fractures that can happen because of osteoporosis, the following is recommended:  If you are 24-85 years old, get at least 1,000 mg of calcium and at least 600 mg of vitamin D per day.  If you are older than age 87 but younger than age 82, get at least 1,200 mg of calcium and at least 600 mg of vitamin D per day.  If you are older than age 35, get at least 1,200 mg of calcium and at least 800 mg of vitamin D per day. Smoking and excessive alcohol intake increase the risk of osteoporosis. Eat foods that are rich in calcium and vitamin D, and do weight-bearing exercises  several times each week as directed by your health care provider. WHAT SHOULD I KNOW ABOUT HOW MENOPAUSE AFFECTS [place]? Depression may occur at any age, but it is more common as you become older. Common symptoms of depression include:  Low or sad mood.  Changes in sleep patterns.  Changes in appetite or eating patterns.  Feeling an overall lack of motivation or enjoyment of activities that you previously enjoyed.  Frequent crying spells. Talk with your health care provider if you think that you are experiencing depression. WHAT SHOULD I KNOW ABOUT IMMUNIZATIONS? It is important that you get and maintain your immunizations. These include:  Tetanus, diphtheria, and pertussis (Tdap) booster vaccine.  Influenza every year before the flu season  begins.  Pneumonia vaccine.  Shingles vaccine. Your health care provider may also recommend other immunizations.   This information is not intended to replace advice given to you by your health care provider. Make sure you discuss any questions you have with your health care provider.   Document Released: 06/01/2005 Document Revised: 04/30/2014 Document Reviewed: 12/10/2013 Elsevier Interactive Patient Education [employer].

## 2015-04-04 NOTE — Progress Notes (Signed)
Pre-visit discussion using our clinic review tool. No additional management support is needed unless otherwise documented below in the visit note.  

## 2015-04-04 NOTE — Progress Notes (Signed)
Patient ID: Mikayla Cuevas, female    DOB: 06/07/1955  Age: 59 y.o. MRN: [id number]  The patient is here for annual wellness examination and management of other chronic and acute problems.  \ 3D MAMMO JAN 2016 TOTAL HYSTERECTOMY 5 YR FOLLOW UP COLONOSCOPY DUE 2017 DEXA SCAN several years ago , normal per patient  Done at [place]   The risk factors are reflected in the social history.  The roster of all physicians providing medical care to patient - is listed in the Snapshot section of the chart.  Home safety : The patient has smoke detectors in the home. They wear seatbelts.  There are no firearms at home. There is no violence in the home.   There is no risks for hepatitis, STDs or HIV. There is no   history of blood transfusion. They have no travel history to infectious disease endemic areas of the world.  The patient has seen their dentist in the last six month. They have seen their eye doctor in the last year. They admit to slight hearing difficulty with regard to whispered voices and some television programs.  They have deferred audiologic testing in the last year.  They do not  have excessive sun exposure. Discussed the need for sun protection: hats, long sleeves and use of sunscreen if there is significant sun exposure.   Diet: the importance of a healthy diet is discussed. They do have a healthy diet.  The benefits of regular aerobic exercise were discussed. She walks 4 times per week ,  20 minutes.   Depression screen: there are no signs or vegative symptoms of depression- irritability, change in appetite, anhedonia, sadness/tearfullness.  The following portions of the patient's history were reviewed and updated as appropriate: allergies, current medications, past family history, past medical history,  past surgical history, past social history  and problem list.  Visual acuity was not assessed per patient preference since she has regular follow up with her ophthalmologist. Hearing  and body mass index were assessed and reviewed.   During the course of the visit the patient was educated and counseled about appropriate screening and preventive services including : fall prevention , diabetes screening, nutrition counseling, colorectal cancer screening, and recommended immunizations.    CC: The primary encounter diagnosis was Breast cancer screening. Diagnoses of Family history of breast cancer, Encounter for preventive health examination, and Familial hyperlipidemia were also pertinent to this visit.  BP BORDERLINE TODAY  CONTINUED INSOMNIA .  Having difficult with initiation and maintenance Dr Jimmye Norman managing depression and anxiety,  Has tried multiple things,  With side effects  Last change 3 weeks ago   History Destoni has a past medical history of Depression; GERD (gastroesophageal reflux disease); Allergy; Hyperlipidemia; History of chicken pox; History of colon polyps; Migraine; Diverticulitis; Psoriasis; B12 deficiency; and Anxiety.   She has past surgical history that includes Abdominal hysterectomy; Cholecystectomy; Tonsillectomy (1975); and Breast surgery.   Her family history includes ADD / ADHD in her daughter; Alcohol abuse in her brother, brother, and father; Bipolar disorder in her daughter; Breast cancer in her sister and sister; Cancer in her father, paternal aunt, and sister; Depression in her daughter and father; Drug abuse in her brother and brother; Heart disease in her mother; Hyperlipidemia in her father, maternal grandmother, and mother; Hypertension in her maternal grandmother and mother.She reports that she quit smoking about 20 years ago. Her smoking use included Cigarettes. She started smoking about 49 years ago. She has a 35 pack-year  smoking history. She quit smokeless tobacco use about 25 years ago. Her smokeless tobacco use included Chew. She reports that she does not drink alcohol or use illicit drugs.  Outpatient Prescriptions Prior to Visit   Medication Sig Dispense Refill  . acetaminophen (TYLENOL) 325 MG tablet Take 325 mg by mouth every 6 (six) hours as needed.    . ALPRAZolam (XANAX) 0.25 MG tablet Take 1 tablet (0.25 mg total) by mouth 2 (two) times daily as needed for anxiety. 60 tablet 0  . aspirin 81 MG tablet Take 81 mg by mouth daily.    . Calcium Carb-Cholecalciferol (CALCIUM + D3 PO) Take 1 tablet by mouth daily.    . cetirizine (ZYRTEC) 10 MG tablet Take 10 mg by mouth daily.    . Cholecalciferol (VITAMIN D3) 2000 UNITS TABS Take 1 tablet by mouth daily.    . clobetasol cream (TEMOVATE) 0.05 % Apply topically 2 (two) times daily. 30 g 1  . Cyanocobalamin (VITAMIN B-12 PO) Take 2,500 mcg by mouth daily.    Marland Kitchen NAPROXEN PO Take 1 tablet by mouth as needed.    . Omega-3 1000 MG CAPS Take 1 capsule twice daily 60 capsule 0  . omeprazole (PRILOSEC) 40 MG capsule Take 1 capsule (40 mg total) by mouth daily. 90 capsule 3  . traZODone (DESYREL) 50 MG tablet Take two to three tablets at bedtime as needed for sleep. 30 tablet 3  . triamcinolone cream (KENALOG) 0.1 % Apply 1 application topically 2 (two) times daily. 30 g 1  . venlafaxine XR (EFFEXOR-XR) 150 MG 24 hr capsule Take 2 capsules (300 mg total) by mouth daily. 180 capsule 1  . Brexpiprazole (REXULTI) 1 MG TABS Take 1 mg by mouth every morning. 30 tablet 1   No facility-administered medications prior to visit.    Review of Systems   Patient denies headache, fevers, malaise, unintentional weight loss, skin rash, eye pain, sinus congestion and sinus pain, sore throat, dysphagia,  hemoptysis , cough, dyspnea, wheezing, chest pain, palpitations, orthopnea, edema, abdominal pain, nausea, melena, diarrhea, constipation, flank pain, dysuria, hematuria, urinary  Frequency, nocturia, numbness, tingling, seizures,  Focal weakness, Loss of consciousness,  Tremor, insomnia, depression, anxiety, and suicidal ideation.      Objective:  BP 140/82 mmHg  Pulse 72  Temp(Src) 97.9  F (36.6 C) (Oral)  Resp 12  Ht 5\' 7"  (1.702 m)  Wt 174 lb 8 oz (79.153 kg)  BMI 27.32 kg/m2  SpO2 98%  Physical Exam   General appearance: alert, cooperative and appears stated age Head: Normocephalic, without obvious abnormality, atraumatic Eyes: conjunctivae/corneas clear. PERRL, EOM's intact. Fundi benign. Ears: normal TM's and external ear canals both ears Nose: Nares normal. Septum midline. Mucosa normal. No drainage or sinus tenderness. Throat: lips, mucosa, and tongue normal; teeth and gums normal Neck: no adenopathy, no carotid bruit, no JVD, supple, symmetrical, trachea midline and thyroid not enlarged, symmetric, no tenderness/mass/nodules Lungs: clear to auscultation bilaterally Breasts: normal appearance, no masses or tenderness Heart: regular rate and rhythm, S1, S2 normal, no murmur, click, rub or gallop Abdomen: soft, non-tender; bowel sounds normal; no masses,  no organomegaly Extremities: extremities normal, atraumatic, no cyanosis or edema Pulses: 2+ and symmetric Skin: Skin color, texture, turgor normal. No rashes or lesions Neurologic: Alert and oriented X 3, normal strength and tone. Normal symmetric reflexes. Normal coordination and gait.   Assessment & Plan:   Problem List Items Addressed This Visit    Family history of breast cancer  3D Mammogram was normal January 2016 breast exam normal today       Encounter for preventive health examination    Annual comprehensive preventive exam was done as well as an evaluation and management of chronic conditions .  During the course of the visit the patient was educated and counseled about appropriate screening and preventive services including :  diabetes screening, lipid analysis, nutrition counseling, colorectal cancer screening, and recommended immunizations.  Printed recommendations for health maintenance screenings was given.       Familial hyperlipidemia    Not tolerating fish oil.  History of statin  intolerance.    Lab Results  Component Value Date   CHOL 284* 01/17/2015   HDL 44.10 01/17/2015   LDLCALC 212* 01/17/2015   LDLDIRECT 222.9 04/08/2014   TRIG 137.0 01/17/2015   CHOLHDL 6 01/17/2015            Other Visit Diagnoses    Breast cancer screening    -  Primary    Relevant Orders    MM DIGITAL SCREENING BILATERAL       I have discontinued Ms. Blankenbeckler's Brexpiprazole. I am also having her start on ondansetron and Red Yeast Rice. Additionally, I am having her maintain her aspirin, Calcium Carb-Cholecalciferol (CALCIUM + D3 PO), Vitamin D3, Cyanocobalamin (VITAMIN B-12 PO), Omega-3, omeprazole, triamcinolone cream, clobetasol cream, acetaminophen, cetirizine, NAPROXEN PO, ALPRAZolam, venlafaxine XR, and traZODone.  Meds ordered this encounter  Medications  . ondansetron (ZOFRAN) 4 MG tablet    Sig: Take 1 tablet (4 mg total) by mouth every 8 (eight) hours as needed for nausea or vomiting.    Dispense:  20 tablet    Refill:  0  . Red Yeast Rice 600 MG CAPS    Sig: Take 1 capsule (600 mg total) by mouth 2 (two) times daily.    Dispense:  60 capsule    Refill:  11    Medications Discontinued During This Encounter  Medication Reason  . Brexpiprazole (REXULTI) 1 MG TABS Patient Preference    Follow-up: No Follow-up on file.   Crecencio Mc, MD

## 2015-04-05 NOTE — Assessment & Plan Note (Signed)
Not tolerating fish oil.  History of statin intolerance.    Lab Results  Component Value Date   CHOL 284* 01/17/2015   HDL 44.10 01/17/2015   LDLCALC 212* 01/17/2015   LDLDIRECT 222.9 04/08/2014   TRIG 137.0 01/17/2015   CHOLHDL 6 01/17/2015

## 2015-04-05 NOTE — Assessment & Plan Note (Addendum)
Annual comprehensive preventive exam was done as well as an evaluation and management of chronic conditions .  During the course of the visit the patient was educated and counseled about appropriate screening and preventive services including :  diabetes screening, lipid analysis, nutrition counseling, colorectal cancer screening, and recommended immunizations.  Printed recommendations for health maintenance screenings was given.   

## 2015-04-05 NOTE — Assessment & Plan Note (Signed)
3D Mammogram was normal January 2016 breast exam normal today

## 2015-04-07 ENCOUNTER — Ambulatory Visit (INDEPENDENT_AMBULATORY_CARE_PROVIDER_SITE_OTHER): Payer: 59 | Admitting: Licensed Clinical Social Worker

## 2015-04-07 DIAGNOSIS — F332 Major depressive disorder, recurrent severe without psychotic features: Secondary | ICD-10-CM | POA: Diagnosis not present

## 2015-04-08 NOTE — Progress Notes (Signed)
   THERAPIST PROGRESS NOTE  Session Time: 4min  Participation Level: Active  Behavioral Response: Casual and NeatAlertDepressed  Type of Therapy: Individual Therapy  Treatment Goals addressed: Coping  Interventions: CBT, Motivational Interviewing, Solution Focused, Strength-based, Supportive, Family Systems and Reframing  Summary: MONA ERBY is a 59 y.o. female who presents with today with continued symptoms of her diagnosis. Discussion of current symptoms. Discussion of past holiday. Export coping skills that were useful. Explored coping skills to use during the Christmas holiday. Discussion of work life. Discussion of most recent incident involving patient. Discussion on how things could have turned out better and would patient can do next time to have a better outcome. Discussion of having a memorial service with her siblings to memorialize her mother. Role-playing deep breathing exercises  Suicidal/Homicidal: Nowithout intent/plan  Therapist Response:LCSW provided Patient with ongoing emotional support and encouragement.  Normalized her feelings.  Commended Patient on her progress and reinforced the importance of client staying focused on her own strengths and resources and resiliency. Processed various strategies for dealing with stressors.    Plan: Return again in 2 weeks.  Diagnosis: Axis I: Major Depression, Recurrent severe    Axis II: No diagnosis    [name] 04/08/2015

## 2015-04-14 ENCOUNTER — Ambulatory Visit: Payer: 59 | Admitting: Psychiatry

## 2015-04-20 NOTE — Progress Notes (Signed)
Pharmacy notified.

## 2015-04-22 ENCOUNTER — Ambulatory Visit (INDEPENDENT_AMBULATORY_CARE_PROVIDER_SITE_OTHER): Payer: 59 | Admitting: Licensed Clinical Social Worker

## 2015-04-22 DIAGNOSIS — F332 Major depressive disorder, recurrent severe without psychotic features: Secondary | ICD-10-CM | POA: Diagnosis not present

## 2015-05-03 ENCOUNTER — Ambulatory Visit: Payer: 59 | Admitting: Psychiatry

## 2015-05-04 NOTE — Progress Notes (Signed)
   THERAPIST PROGRESS NOTE  Session Time: 63min  Participation Level: Active  Behavioral Response: Casual and NeatAlertDepressed  Type of Therapy: Individual Therapy  Treatment Goals addressed: Coping  Interventions: CBT, Motivational Interviewing, Solution Focused, Supportive, Family Systems and Reframing  Summary: Mikayla Cuevas is a 60 y.o. female who presents with continued symtoms of her diagnosis.  She reports having a pleasant holiday.  She states that she worked most of the holiday and was able to spend time with her children and Grandchildren.  She reports that she wants to make a few New Years Resolution to better herself starting with a change in job.  Although she is torn between a Monday-Friday position or 3rd shift for the increase in pay.  She explored financial options and was able to determine that she will be less helpful to her son and Mikayla Cuevas if she accepts the Mon-Friday 8-5 position.  She was able to list pros and cons of each.   Suicidal/Homicidal: Nowithout intent/plan  Therapist Response: LCSW provided Patient with ongoing emotional support and encouragement.  Normalized her feelings.  Commended Patient on her progress and reinforced the importance of client staying focused on her own strengths and resources and resiliency. Processed various strategies for dealing with stressors.    Plan: Return again in2 weeks.  Diagnosis: Axis I: Major Depression, Recurrent severe    Axis II: No diagnosis    [name] 04/22/2015

## 2015-05-13 ENCOUNTER — Ambulatory Visit (INDEPENDENT_AMBULATORY_CARE_PROVIDER_SITE_OTHER): Payer: 59 | Admitting: Licensed Clinical Social Worker

## 2015-05-13 DIAGNOSIS — F332 Major depressive disorder, recurrent severe without psychotic features: Secondary | ICD-10-CM

## 2015-05-18 ENCOUNTER — Encounter: Payer: Self-pay | Admitting: Psychiatry

## 2015-05-18 ENCOUNTER — Ambulatory Visit (INDEPENDENT_AMBULATORY_CARE_PROVIDER_SITE_OTHER): Payer: 59 | Admitting: Psychiatry

## 2015-05-18 VITALS — BP 128/98 | HR 95 | Temp 97.9°F | Ht 67.0 in | Wt 180.4 lb

## 2015-05-18 DIAGNOSIS — G4700 Insomnia, unspecified: Secondary | ICD-10-CM | POA: Diagnosis not present

## 2015-05-18 DIAGNOSIS — F329 Major depressive disorder, single episode, unspecified: Secondary | ICD-10-CM | POA: Diagnosis not present

## 2015-05-18 DIAGNOSIS — F32A Depression, unspecified: Secondary | ICD-10-CM

## 2015-05-18 MED ORDER — ALPRAZOLAM 0.25 MG PO TABS: 0.2500 mg | ORAL_TABLET | Freq: Two times a day (BID) | ORAL | Status: DC | PRN

## 2015-05-18 NOTE — Progress Notes (Signed)
[facility] MD/PA/NP OP Progress Note  05/18/2015 12:22 PM Mikayla Cuevas  MRN:  [id number]  Subjective: Patient returns for follow-up of her major depressive disorder, recurrent severe without psychotic features. Patient still feels overall that being on the Effexor has helped her depression tremendously from where she was before she started medication. We have tried augmentation strategies. We tried Seroquel but that was not effective. At her last appointment we tried Rexulti and she cannot tolerate that more than one week. He indicated the medication made her cry more and caused emotional instability.  This point she would like to just continue on her current regimen and see the new provider in the clinic to discuss any changes. It does appear she feels like Effexor is still helpful and thus her main augmentation strategy might be to try to use Abilify which the patient has not had before or alternatively to try a new antidepressant.  She feels overall stable. She does feel like she might have about 3 days where her depression comes on each month and she does not feel like getting out of bed. 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   Chief Complaint: continue with what I'm on Chief Complaint    Follow-up; Medication Refill; Anxiety; Stress; Fatigue     Visit Diagnosis:     ICD-9-CM ICD-10-CM   1. Depression 311 F32.9   2. Insomnia 780.52 G47.00     Past Medical History:  Past Medical History  Diagnosis Date  . Depression   . GERD (gastroesophageal reflux disease)   . Allergy   . Hyperlipidemia   . History of chicken pox   . History of colon polyps     Colonoscopy 2012 - repeat in 10 years - Dr. Vira Agar  . Migraine   . Diverticulitis   . Psoriasis     Clobetasol   . B12 deficiency   . Anxiety     Past Surgical History  Procedure Laterality Date  . Abdominal hysterectomy    . Cholecystectomy    . Tonsillectomy  1975  . Breast surgery     Family History:   Family History  Problem Relation Age of Onset  . Hyperlipidemia Mother   . Hypertension Mother   . Heart disease Mother   . Alcohol abuse Father   . Cancer Father     breast  . Hyperlipidemia Father   . Depression Father   . Cancer Sister     2 sisters with breast cancer  . Cancer Paternal Aunt     breast   . Hyperlipidemia Maternal Grandmother   . Hypertension Maternal Grandmother   . Alcohol abuse Brother   . Drug abuse Brother   . ADD / ADHD Daughter   . Bipolar disorder Daughter   . Depression Daughter   . Breast cancer Sister   . Breast cancer Sister   . Alcohol abuse Brother   . Drug abuse Brother    Social History:  Social History   Social History  . Marital Status: Married    Spouse Name: N/A  . Number of Children: 2  . Years of Education: N/A   Occupational History  . ER Tech     [employer]   Social History Main Topics  . Smoking status: Former Smoker -- 1.00 packs/day for 35 years    Types: Cigarettes    Start date: 01/31/1966    Quit date: 02/01/1995  . Smokeless tobacco: Former Systems developer    Types: Loss adjuster, chartered  Quit date: 04/29/1989  . Alcohol Use: No  . Drug Use: No  . Sexual Activity: Not Currently   Other Topics Concern  . None   Social History Narrative   Hobbies: grandchildren, guitar   Caffeine: 1 cup daily   Exercise: Not at present   Diet: needs improvement   Additional History:   Assessment:   Musculoskeletal: Strength & Muscle Tone: within normal limits Gait & Station: normal Patient leans: N/A  Psychiatric Specialty Exam: Anxiety Symptoms include insomnia. Patient reports no nervous/anxious behavior or suicidal ideas.      Review of Systems  Psychiatric/Behavioral: Positive for depression ( it comes in 3 day periods each month). Negative for suicidal ideas, hallucinations, memory loss and substance abuse. The patient has insomnia. The patient is not nervous/anxious.   All other systems reviewed and are negative.   Blood pressure  128/98, pulse 95, temperature 97.9 F (36.6 C), temperature source Tympanic, height 5\' 7"  (1.702 m), weight 180 lb 6.4 oz (81.829 kg), SpO2 95 %.Body mass index is 28.25 kg/(m^2).  General Appearance: Well Groomed  Eye Contact:  Good  Speech:  Normal Rate  Volume:  Normal  Mood:  Okay  Affect:  Constricted  Thought Process:  Linear  Orientation:  Full (Time, Place, and Person)  Thought Content:  Negative  Suicidal Thoughts:  No  Homicidal Thoughts:  No  Memory:  Immediate;   Good Recent;   Good Remote;   Good  Judgement:  Good  Insight:  Good  Psychomotor Activity:  Negative  Concentration:  Good  Recall:  Good  Fund of Knowledge: Good  Language: Good  Akathisia:  Negative  Handed:  Right  AIMS (if indicated):  Done 02/01/15 normal  Assets:  Communication Skills Desire for Improvement Vocational/Educational  ADL's:  Intact  Cognition: WNL  Sleep:  poor   Is the patient at risk to self?  No. Has the patient been a risk to self in the past 6 months?  No. Has the patient been a risk to self within the distant past?  Yes.   Is the patient a risk to others?  No. Has the patient been a risk to others in the past 6 months?  No. Has the patient been a risk to others within the distant past?  No.  Current Medications: Current Outpatient Prescriptions  Medication Sig Dispense Refill  . acetaminophen (TYLENOL) 325 MG tablet Take 325 mg by mouth every 6 (six) hours as needed.    . ALPRAZolam (XANAX) 0.25 MG tablet Take 1 tablet (0.25 mg total) by mouth 2 (two) times daily as needed for anxiety. 60 tablet 1  . aspirin 81 MG tablet Take 81 mg by mouth daily.    . Calcium Carb-Cholecalciferol (CALCIUM + D3 PO) Take 1 tablet by mouth daily.    . cetirizine (ZYRTEC) 10 MG tablet Take 10 mg by mouth daily.    . Cholecalciferol (VITAMIN D3) 2000 UNITS TABS Take 1 tablet by mouth daily.    . clobetasol cream (TEMOVATE) 0.05 % Apply topically 2 (two) times daily. 30 g 1  . Cyanocobalamin  (VITAMIN B-12 PO) Take 2,500 mcg by mouth daily.    Marland Kitchen NAPROXEN PO Take 1 tablet by mouth as needed.    Marland Kitchen omeprazole (PRILOSEC) 40 MG capsule Take 1 capsule (40 mg total) by mouth daily. 90 capsule 3  . ondansetron (ZOFRAN) 4 MG tablet Take 1 tablet (4 mg total) by mouth every 8 (eight) hours as needed for nausea or  vomiting. 20 tablet 0  . Red Yeast Rice 600 MG CAPS Take 1 capsule (600 mg total) by mouth 2 (two) times daily. 60 capsule 11  . traZODone (DESYREL) 50 MG tablet Take two to three tablets at bedtime as needed for sleep. 30 tablet 3  . triamcinolone cream (KENALOG) 0.1 % Apply 1 application topically 2 (two) times daily. 30 g 1  . venlafaxine XR (EFFEXOR-XR) 150 MG 24 hr capsule Take 2 capsules (300 mg total) by mouth daily. 180 capsule 1  . Omega-3 1000 MG CAPS Take 1 capsule twice daily (Patient not taking: Reported on 05/18/2015) 60 capsule 0   No current facility-administered medications for this visit.    Medical Decision Making:  Established Problem, Worsening (2), Review of Medication Regimen & Side Effects (2) and Review of New Medication or Change in Dosage (2)  Treatment Plan Summary:Medication management and Plan Plan Major depressive disorder, severe without psychotic features-patient is largely been stable on her medication regimen since the late 90s. However she is noticed a worsening depression since September of this year. Her previous psychiatrist had her on Effexor 300 mg daily.   Anxiety- alprazolam 0.25 mg twice a day as needed for anxiety. She states she does not use this on a daily basis.   Insomnia-patient does not take trazodone regularly and she takes 2 tablets and sometimes 3 tablets..  Patient will follow up in 3 month. She's been encouraged call any questions or concerns prior to her next appointment.  Faith Rogue 05/18/2015, 12:22 PM

## 2015-05-23 NOTE — Progress Notes (Signed)
   THERAPIST PROGRESS NOTE  Session Time: 41min  Participation Level: Active  Behavioral Response: CasualAlertDepressed  Type of Therapy: Individual Therapy  Treatment Goals addressed: Coping  Interventions: CBT, Motivational Interviewing, Solution Focused and Supportive  Summary: Mikayla Cuevas is a 60 y.o. female who presents with continued symptoms of her diagnosis. She was able to discuss her current stressors with her personal life, work and her family concerns.  She continues to have difficulty while at work with communication with peers and supervisors.  She continues to have difficulty with grief and loss of her mother.  Role played communication and coping skills.  Suicidal/Homicidal: Nowithout intent/plan  Therapist Response: LCSW provided Patient with ongoing emotional support and encouragement.  Normalized her feelings.  Commended Patient on her progress and reinforced the importance of client staying focused on her own strengths and resources and resiliency. Processed various strategies for dealing with stressors.    Plan: Return again in2 weeks.  Diagnosis: Axis I: Depression    Axis II: No diagnosis    [name] 05/13/2015

## 2015-05-27 ENCOUNTER — Ambulatory Visit (INDEPENDENT_AMBULATORY_CARE_PROVIDER_SITE_OTHER): Payer: 59 | Admitting: Licensed Clinical Social Worker

## 2015-05-27 DIAGNOSIS — F332 Major depressive disorder, recurrent severe without psychotic features: Secondary | ICD-10-CM

## 2015-06-03 NOTE — Progress Notes (Signed)
   THERAPIST PROGRESS NOTE  Session Time: 20mn  Participation Level: Active  Behavioral Response: CasualAlertDepressed  Type of Therapy: Individual Therapy  Treatment Goals addressed: Coping  Interventions: CBT, Motivational Interviewing, Solution Focused and Reframing  Summary: Mikayla HERSHBERGERis a 60y.o. female who presents with continued symptoms of her diagnosis.  Therapist met with Client in an outpatient setting to assess current mood and assist with making progress towards her goals through the use of therapeutic intervention. Therapist did a brief mood check, assessing anger, fear, disgust, excitement, happiness, and sadness. Therapist provided active listening for Client as she communicated her current stressors. Therapist explained the importance of establishing emotional support as well as ensuring that Client is engaging in adequate self-care during these stress induced times. Therapist then assisted Client with brainstorming to determine a solution for her current problem. Therapist and Client discussed possible financial supports, emotional supports, family, friends, etc. Therapist encouraged Client to try to remain positive regarding her situation. Therapist suggested healthy releases such as journaling, listening to music, voice recording, and drawing. Therapist reflected on last week's session, reminding Client that how we think influences how we feel and act. Therapist educated Client on information regarding depression and how Cognitive Behavioral Therapy can assist with taking control of her life. Therapist explained the mood thermometer and requested that Client complete one during the week before therapy to be discussed in session to gauge Client's progress throughout.  Therapist praised Client for her participation in session and set a time and date for the next meeting.   Suicidal/Homicidal: Nowithout intent/plan  Therapist Response: LCSW provided Patient with ongoing  emotional support and encouragement.  Normalized her feelings.  Commended Patient on her progress and reinforced the importance of client staying focused on her own strengths and resources and resiliency. Processed various strategies for dealing with stressors.    Plan: Return again in 2 weeks.  Diagnosis: Axis I: Major Depression, Recurrent severe    Axis II: No diagnosis    NLubertha South02/06/2015

## 2015-06-08 ENCOUNTER — Ambulatory Visit (INDEPENDENT_AMBULATORY_CARE_PROVIDER_SITE_OTHER): Payer: 59 | Admitting: Licensed Clinical Social Worker

## 2015-06-08 DIAGNOSIS — F329 Major depressive disorder, single episode, unspecified: Secondary | ICD-10-CM | POA: Diagnosis not present

## 2015-06-08 DIAGNOSIS — F32A Depression, unspecified: Secondary | ICD-10-CM

## 2015-06-21 NOTE — Progress Notes (Signed)
   THERAPIST PROGRESS NOTE  Session Time: 40min  Participation Level: Active  Behavioral Response: Casual and NeatAlertDepressed  Type of Therapy: Individual Therapy  Treatment Goals addressed: Coping and Diagnosis: Depression  Interventions: CBT, Motivational Interviewing, Solution Focused, Strength-based, Supportive, Family Systems and Reframing  Summary: Mikayla Cuevas is a 60 y.o. female who presents with continued symptoms of her diagnosis.  Patient was able to list her current stressors and how she has managed them lately.  States that she is learning to cope with her co workers.  Patient is attempting to change jobs to spend more time with her Grandchildren. Therapist listened and challenged Patient to explore alternate ways of explaining her point without engaging in negative verbal expressions.  Therapist assisted Patient and provided additional support and feedback to address Patient's concerns regarding the disagreement.  Therapist shifted focus of discussion to managing current symptoms, avoiding triggers improving problem solving skills. Discussion of Therapist going on [place] and provided patient with a list of therapist in the area.  Suicidal/Homicidal: Nowithout intent/plan  Therapist Response: LCSW provided Patient with ongoing emotional support and encouragement.  Normalized her feelings.  Commended Patient on her progress and reinforced the importance of client staying focused on her own strengths and resources and resiliency. Processed various strategies for dealing with stressors.    Plan: Discussion of Therapist going on [place] and provided patient with a list of therapist in the area.  Diagnosis: Axis I: Major Depression, Recurrent severe    Axis II: No diagnosis    [name] 06/10/2015

## 2015-07-25 ENCOUNTER — Other Ambulatory Visit: Payer: Self-pay | Admitting: Internal Medicine

## 2015-08-16 ENCOUNTER — Ambulatory Visit: Payer: 59 | Admitting: Psychiatry

## 2015-08-16 ENCOUNTER — Encounter: Payer: Self-pay | Admitting: Psychiatry

## 2015-08-16 ENCOUNTER — Ambulatory Visit (INDEPENDENT_AMBULATORY_CARE_PROVIDER_SITE_OTHER): Payer: 59 | Admitting: Psychiatry

## 2015-08-16 VITALS — BP 138/86 | HR 93 | Temp 97.2°F | Ht 67.0 in | Wt 179.0 lb

## 2015-08-16 DIAGNOSIS — F411 Generalized anxiety disorder: Secondary | ICD-10-CM | POA: Diagnosis not present

## 2015-08-16 DIAGNOSIS — F331 Major depressive disorder, recurrent, moderate: Secondary | ICD-10-CM

## 2015-08-16 MED ORDER — ALPRAZOLAM 0.25 MG PO TABS: 0.2500 mg | ORAL_TABLET | Freq: Two times a day (BID) | ORAL | Status: DC | PRN

## 2015-08-16 MED ORDER — VENLAFAXINE HCL ER 150 MG PO CP24
300.0000 mg | ORAL_CAPSULE | Freq: Every day | ORAL | Status: DC
Start: 2015-08-16 — End: 2015-10-20

## 2015-08-16 NOTE — Progress Notes (Signed)
[facility] MD/PA/NP OP Progress Note  08/16/2015 10:44 AM AILED HANAS  MRN:  [id number]  Subjective: Patient is a 60 year old female with history of depression who presented for follow-up appointment. 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 She feels happy and excited and reported that she is now working 3- 12 hour shifts. She reported that her medications are helping her and she is taking Xanax on a when necessary basis. She continues to take Effexor 300 mg in the morning. It has been helping with her depressive symptoms. She has tried several psycho topic medications in the past to help with her depression and reported that other medications are not as effective. She does not want to take anything else. She stated that she enjoys time with her family. She is planning a birthday party for her granddaughter this evening. She is actively involved. She sleeps well at night. She denied having any suicidal homicidal ideations or plans. She feels stable and reported that the depressive symptoms are not bothering her at this time.   Chief Complaint:  Chief Complaint    Follow-up; Medication Refill     Visit Diagnosis:     ICD-9-CM ICD-10-CM   1. MDD (major depressive disorder), recurrent episode, moderate (HCC) 296.32 F33.1   2. Anxiety state 300.00 F41.1     Past Medical History:  Past Medical History  Diagnosis Date  . Depression   . GERD (gastroesophageal reflux disease)   . Allergy   . Hyperlipidemia   . History of chicken pox   . History of colon polyps     Colonoscopy 2012 - repeat in 10 years - Dr. Vira Agar  . Migraine   . Diverticulitis   . Psoriasis     Clobetasol   . B12 deficiency   . Anxiety     Past Surgical History  Procedure Laterality Date  . Abdominal hysterectomy    . Cholecystectomy    . Tonsillectomy  1975  . Breast surgery     Family History:  Family History  Problem Relation Age of Onset  . Hyperlipidemia  Mother   . Hypertension Mother   . Heart disease Mother   . Alcohol abuse Father   . Cancer Father     breast  . Hyperlipidemia Father   . Depression Father   . Cancer Sister     2 sisters with breast cancer  . Cancer Paternal Aunt     breast   . Hyperlipidemia Maternal Grandmother   . Hypertension Maternal Grandmother   . Alcohol abuse Brother   . Drug abuse Brother   . ADD / ADHD Daughter   . Bipolar disorder Daughter   . Depression Daughter   . Breast cancer Sister   . Breast cancer Sister   . Alcohol abuse Brother   . Drug abuse Brother    Social History:  Social History   Social History  . Marital Status: Married    Spouse Name: N/A  . Number of Children: 2  . Years of Education: N/A   Occupational History  . ER Tech     [employer]   Social History Main Topics  . Smoking status: Former Smoker -- 1.00 packs/day for 35 years    Types: Cigarettes    Start date: 01/31/1966    Quit date: 02/01/1995  . Smokeless tobacco: Former Systems developer    Types: [place] date: 04/29/1989  . Alcohol Use: No  .  Drug Use: No  . Sexual Activity: Not Currently   Other Topics Concern  . None   Social History Narrative   Hobbies: grandchildren, guitar   Caffeine: 1 cup daily   Exercise: Not at present   Diet: needs improvement   Additional History:   Assessment:   Musculoskeletal: Strength & Muscle Tone: within normal limits Gait & Station: normal Patient leans: N/A  Psychiatric Specialty Exam: Anxiety Symptoms include insomnia. Patient reports no nervous/anxious behavior or suicidal ideas.      Review of Systems  Psychiatric/Behavioral: Positive for depression ( it comes in 3 day periods each month). Negative for suicidal ideas, hallucinations, memory loss and substance abuse. The patient has insomnia. The patient is not nervous/anxious.   All other systems reviewed and are negative.   Blood pressure 138/86, pulse 93, temperature 97.2 F (36.2 C), temperature source  Tympanic, height 5\' 7"  (1.702 m), weight 179 lb (81.194 kg), SpO2 93 %.Body mass index is 28.03 kg/(m^2).  General Appearance: Well Groomed  Eye Contact:  Good  Speech:  Normal Rate  Volume:  Normal  Mood:  Okay  Affect:  Congruent  Thought Process:  Linear  Orientation:  Full (Time, Place, and Person)  Thought Content:  Negative  Suicidal Thoughts:  No  Homicidal Thoughts:  No  Memory:  Immediate;   Good Recent;   Good Remote;   Good  Judgement:  Good  Insight:  Good  Psychomotor Activity:  Normal  Concentration:  Good  Recall:  Good  Fund of Knowledge: Good  Language: Good  Akathisia:  Negative  Handed:  Right  AIMS (if indicated):  Done 02/01/15 normal  Assets:  Communication Skills Desire for Improvement Vocational/Educational  ADL's:  Intact  Cognition: WNL  Sleep:  poor   Is the patient at risk to self?  No. Has the patient been a risk to self in the past 6 months?  No. Has the patient been a risk to self within the distant past?  Yes.   Is the patient a risk to others?  No. Has the patient been a risk to others in the past 6 months?  No. Has the patient been a risk to others within the distant past?  No.  Current Medications: Current Outpatient Prescriptions  Medication Sig Dispense Refill  . acetaminophen (TYLENOL) 325 MG tablet Take 325 mg by mouth every 6 (six) hours as needed.    . ALPRAZolam (XANAX) 0.25 MG tablet Take 1 tablet (0.25 mg total) by mouth 2 (two) times daily as needed for anxiety. 60 tablet 1  . aspirin 81 MG tablet Take 81 mg by mouth daily.    . Calcium Carb-Cholecalciferol (CALCIUM + D3 PO) Take 1 tablet by mouth daily.    . cetirizine (ZYRTEC) 10 MG tablet Take 10 mg by mouth daily.    . Cholecalciferol (VITAMIN D3) 2000 UNITS TABS Take 1 tablet by mouth daily.    . clobetasol cream (TEMOVATE) 0.05 % APPLY TOPICALLY 2 TIMES DAILY. 30 g 1  . Cyanocobalamin (VITAMIN B-12 PO) Take 2,500 mcg by mouth daily.    Marland Kitchen NAPROXEN PO Take 1 tablet by  mouth as needed.    . Omega-3 1000 MG CAPS Take 1 capsule twice daily 60 capsule 0  . omeprazole (PRILOSEC) 40 MG capsule TAKE 1 CAPSULE BY MOUTH DAILY. 90 capsule 3  . ondansetron (ZOFRAN) 4 MG tablet Take 1 tablet (4 mg total) by mouth every 8 (eight) hours as needed for nausea or vomiting. [place]  tablet 0  . triamcinolone cream (KENALOG) 0.1 % APPLY TOPICALLY 2 TIMES DAILY. 30 g 1  . venlafaxine XR (EFFEXOR-XR) 150 MG 24 hr capsule Take 2 capsules (300 mg total) by mouth daily. 180 capsule 1   No current facility-administered medications for this visit.    Medical Decision Making:  Established Problem, Worsening (2), Review of Medication Regimen & Side Effects (2) and Review of New Medication or Change in Dosage (2)  Treatment Plan Summary:Medication management and Plan Plan She will continue on Effexor XR 300 mg in the morning. She will continue on Xanax 0.25 mg twice daily as needed for anxiety. She reported that she does not take the medications on a regular basis. She will follow-up in 2 months or earlier depending on her symptoms    More than 50% of the time spent in psychoeducation, counseling and coordination of care.    This note was generated in part or whole with voice recognition software. Voice regonition is usually quite accurate but there are transcription errors that can and very often do occur. I apologize for any typographical errors that were not detected and corrected.   Rainey Pines, MD  08/16/2015, 10:44 AM

## 2015-10-13 ENCOUNTER — Ambulatory Visit: Payer: Self-pay | Admitting: Psychiatry

## 2015-10-20 ENCOUNTER — Ambulatory Visit (INDEPENDENT_AMBULATORY_CARE_PROVIDER_SITE_OTHER): Payer: BC Managed Care – PPO | Admitting: Psychiatry

## 2015-10-20 ENCOUNTER — Encounter: Payer: Self-pay | Admitting: Psychiatry

## 2015-10-20 VITALS — BP 140/90 | HR 98 | Temp 97.4°F | Ht 67.0 in | Wt 180.0 lb

## 2015-10-20 DIAGNOSIS — F331 Major depressive disorder, recurrent, moderate: Secondary | ICD-10-CM

## 2015-10-20 MED ORDER — VENLAFAXINE HCL ER 75 MG PO CP24: 75.0000 mg | ORAL_CAPSULE | Freq: Every day | ORAL | Status: DC

## 2015-10-20 MED ORDER — ALPRAZOLAM 0.25 MG PO TABS: 0.2500 mg | ORAL_TABLET | Freq: Two times a day (BID) | ORAL | Status: DC | PRN

## 2015-10-20 MED ORDER — VENLAFAXINE HCL ER 150 MG PO CP24: 150.0000 mg | ORAL_CAPSULE | Freq: Every day | ORAL | Status: DC

## 2015-10-20 NOTE — Progress Notes (Signed)
BH MD/PA/NP OP Progress Note  10/20/2015 2:44 PM Mikayla Cuevas  MRN:  [id number]  Subjective: Patient is a 60 year old female with history of depression who presented for follow-up appointment. He reported that she has recently switched her job and has started working in another hospital in  [place]. Patient reported that she is doing well. However it was noted that her blood pressure was elevated and she also mentioned that she has increased sweating. She is on high dose of Effexor. Patient admits that she feels hyper at times on the medication. We discussed about adjusting the dose of the Effexor and she agreed with the plan. She reported that she does not have any other adverse reactions from the medication. She takes Ativan on a when necessary basis. She denied having any suicidal homicidal ideations or plans. She has been enjoying time with her family and she sleeps well at night. She denied having any perceptual disturbances. She denied using any drugs or alcohol at this time.     Chief Complaint:  Chief Complaint    Follow-up; Medication Refill     Visit Diagnosis:     ICD-9-CM ICD-10-CM   1. MDD (major depressive disorder), recurrent episode, moderate ([place]) 296.32 F33.1     Past Medical History:  Past Medical History  Diagnosis Date  . Depression   . GERD (gastroesophageal reflux disease)   . Allergy   . Hyperlipidemia   . History of chicken pox   . History of colon polyps     Colonoscopy 2012 - repeat in 10 years - Dr. Vira Agar  . Migraine   . Diverticulitis   . Psoriasis     Clobetasol   . B12 deficiency   . Anxiety     Past Surgical History  Procedure Laterality Date  . Abdominal hysterectomy    . Cholecystectomy    . Tonsillectomy  1975  . Breast surgery     Family History:  Family History  Problem Relation Age of Onset  . Hyperlipidemia Mother   . Hypertension Mother   . Heart disease Mother   . Alcohol abuse Father   . Cancer Father     breast  .  Hyperlipidemia Father   . Depression Father   . Cancer Sister     2 sisters with breast cancer  . Cancer Paternal Aunt     breast   . Hyperlipidemia Maternal Grandmother   . Hypertension Maternal Grandmother   . Alcohol abuse Brother   . Drug abuse Brother   . ADD / ADHD Daughter   . Bipolar disorder Daughter   . Depression Daughter   . Breast cancer Sister   . Breast cancer Sister   . Alcohol abuse Brother   . Drug abuse Brother    Social History:  Social History   Social History  . Marital Status: Married    Spouse Name: N/A  . Number of Children: 2  . Years of Education: N/A   Occupational History  . ER Tech     [employer]   Social History Main Topics  . Smoking status: Former Smoker -- 1.00 packs/day for 35 years    Types: Cigarettes    Start date: 01/31/1966    Quit date: 02/01/1995  . Smokeless tobacco: Former Systems developer    Types: [place] date: 04/29/1989  . Alcohol Use: No  . Drug Use: No  . Sexual Activity: Not Currently   Other Topics Concern  . None   Social  History Narrative   Hobbies: grandchildren, guitar   Caffeine: 1 cup daily   Exercise: Not at present   Diet: needs improvement   Additional History:   Assessment:   Musculoskeletal: Strength & Muscle Tone: within normal limits Gait & Station: normal Patient leans: N/A  Psychiatric Specialty Exam: Anxiety Symptoms include insomnia. Patient reports no nervous/anxious behavior or suicidal ideas.      Review of Systems  Psychiatric/Behavioral: Positive for depression ( it comes in 3 day periods each month). Negative for suicidal ideas, hallucinations, memory loss and substance abuse. The patient has insomnia. The patient is not nervous/anxious.   All other systems reviewed and are negative.   Blood pressure 140/90, pulse 98, temperature 97.4 F (36.3 C), temperature source Tympanic, height 5\' 7"  (1.702 m), weight 180 lb (81.647 kg), SpO2 98 %.Body mass index is 28.19 kg/(m^2).  General  Appearance: Well Groomed  Eye Contact:  Good  Speech:  Normal Rate  Volume:  Normal  Mood:  Okay  Affect:  Congruent  Thought Process:  Linear  Orientation:  Full (Time, Place, and Person)  Thought Content:  Negative  Suicidal Thoughts:  No  Homicidal Thoughts:  No  Memory:  Immediate;   Good Recent;   Good Remote;   Good  Judgement:  Good  Insight:  Good  Psychomotor Activity:  Normal  Concentration:  Good  Recall:  Good  Fund of Knowledge: Good  Language: Good  Akathisia:  Negative  Handed:  Right  AIMS (if indicated):  Done 02/01/15 normal  Assets:  Communication Skills Desire for Improvement Vocational/Educational  ADL's:  Intact  Cognition: WNL  Sleep:  poor   Is the patient at risk to self?  No. Has the patient been a risk to self in the past 6 months?  No. Has the patient been a risk to self within the distant past?  Yes.   Is the patient a risk to others?  No. Has the patient been a risk to others in the past 6 months?  No. Has the patient been a risk to others within the distant past?  No.  Current Medications: Current Outpatient Prescriptions  Medication Sig Dispense Refill  . acetaminophen (TYLENOL) 325 MG tablet Take 325 mg by mouth every 6 (six) hours as needed.    . ALPRAZolam (XANAX) 0.25 MG tablet Take 1 tablet (0.25 mg total) by mouth 2 (two) times daily as needed for anxiety. 60 tablet 1  . aspirin 81 MG tablet Take 81 mg by mouth daily.    . Calcium Carb-Cholecalciferol (CALCIUM + D3 PO) Take 1 tablet by mouth daily.    . cetirizine (ZYRTEC) 10 MG tablet Take 10 mg by mouth daily.    . Cholecalciferol (VITAMIN D3) 2000 UNITS TABS Take 1 tablet by mouth daily.    . clobetasol cream (TEMOVATE) 0.05 % APPLY TOPICALLY 2 TIMES DAILY. 30 g 1  . Cyanocobalamin (VITAMIN B-12 PO) Take 2,500 mcg by mouth daily.    Marland Kitchen NAPROXEN PO Take 1 tablet by mouth as needed.    . Omega-3 1000 MG CAPS Take 1 capsule twice daily 60 capsule 0  . omeprazole (PRILOSEC) 40 MG  capsule TAKE 1 CAPSULE BY MOUTH DAILY. 90 capsule 3  . ondansetron (ZOFRAN) 4 MG tablet Take 1 tablet (4 mg total) by mouth every 8 (eight) hours as needed for nausea or vomiting. 20 tablet 0  . triamcinolone cream (KENALOG) 0.1 % APPLY TOPICALLY 2 TIMES DAILY. 30 g 1  . venlafaxine  XR (EFFEXOR-XR) 150 MG 24 hr capsule Take 2 capsules (300 mg total) by mouth daily. 180 capsule 1   No current facility-administered medications for this visit.    Medical Decision Making:  Established Problem, Worsening (2), Review of Medication Regimen & Side Effects (2) and Review of New Medication or Change in Dosage (2)  Treatment Plan Summary:Medication management and Plan Plan She will continue on Effexor XR 150  mg in the morning and 75 mg in the evening. She will continue on Xanax 0.25 mg twice daily as needed for anxiety. She reported that she does not take the medications on a regular basis. She will follow-up in 2 months or earlier depending on her symptoms    More than 50% of the time spent in psychoeducation, counseling and coordination of care.    This note was generated in part or whole with voice recognition software. Voice regonition is usually quite accurate but there are transcription errors that can and very often do occur. I apologize for any typographical errors that were not detected and corrected.   Rainey Pines, MD  10/20/2015, 2:44 PM

## 2015-11-23 ENCOUNTER — Other Ambulatory Visit: Payer: Self-pay | Admitting: Internal Medicine

## 2015-11-23 ENCOUNTER — Ambulatory Visit
Admission: RE | Admit: 2015-11-23 | Discharge: 2015-11-23 | Disposition: A | Discharge status: Home / Self Care | Payer: BC Managed Care – PPO | Source: Ambulatory Visit | Attending: Internal Medicine | Admitting: Internal Medicine

## 2015-11-23 DIAGNOSIS — Z1239 Encounter for other screening for malignant neoplasm of breast: Secondary | ICD-10-CM | POA: Insufficient documentation

## 2015-11-23 DIAGNOSIS — Z1231 Encounter for screening mammogram for malignant neoplasm of breast: Secondary | ICD-10-CM | POA: Diagnosis not present

## 2015-12-20 ENCOUNTER — Ambulatory Visit (INDEPENDENT_AMBULATORY_CARE_PROVIDER_SITE_OTHER): Payer: BC Managed Care – PPO | Admitting: Psychiatry

## 2015-12-20 ENCOUNTER — Encounter: Payer: Self-pay | Admitting: Psychiatry

## 2015-12-20 VITALS — BP 153/83 | HR 72 | Temp 98.0°F | Ht 67.0 in | Wt 179.2 lb

## 2015-12-20 DIAGNOSIS — F331 Major depressive disorder, recurrent, moderate: Secondary | ICD-10-CM

## 2015-12-20 DIAGNOSIS — F411 Generalized anxiety disorder: Secondary | ICD-10-CM | POA: Diagnosis not present

## 2015-12-20 MED ORDER — VENLAFAXINE HCL ER 75 MG PO CP24: 75.0000 mg | ORAL_CAPSULE | Freq: Every day | ORAL | 1 refills | Status: DC

## 2015-12-20 MED ORDER — VENLAFAXINE HCL ER 150 MG PO CP24: 150.0000 mg | ORAL_CAPSULE | Freq: Every day | ORAL | 1 refills | Status: DC

## 2015-12-20 MED ORDER — ALPRAZOLAM 0.25 MG PO TABS: 0.2500 mg | ORAL_TABLET | Freq: Two times a day (BID) | ORAL | 1 refills | Status: DC | PRN

## 2015-12-20 NOTE — Progress Notes (Signed)
BH MD/PA/NP OP Progress Note  12/20/2015 1:18 PM Mikayla Cuevas  MRN:  [id number]  Subjective: Patient is a 60 year old female with history of depression who presented for follow-up appointment. She  reported that she has started taking Xanax on a when necessary basis as she feels that the Xanax is helping with her anxiety. She reported that she has been working long hours and work 6 days per week. She reported that sometimes she will become exhausted and is unable to sleep at night. She has tried Tylenol PM as well. She reported that the Effexor is helping her with her depression and anxiety and she has tried several other medications the past and they were not effective. She is not willing to change her medications at this time. She reported that she does not want to take any other medications to help her sleep at night as she has already tried trazodone in the past. She appeared calm and collected during the interview. She currently denied having any suicidal ideations or plans. She reported that the Xanax is helping her and she wants to continue taking it on a when necessary basis.  She is receptive to her medications and has been compliant.  Chief Complaint:  Chief Complaint    Follow-up; Medication Refill     Visit Diagnosis:     ICD-9-CM ICD-10-CM   1. MDD (major depressive disorder), recurrent episode, moderate ([facility]) 296.32 F33.1   2. Anxiety state 300.00 F41.1     Past Medical History:  Past Medical History:  Diagnosis Date  . Allergy   . Anxiety   . B12 deficiency   . Depression   . Diverticulitis   . GERD (gastroesophageal reflux disease)   . History of chicken pox   . History of colon polyps    Colonoscopy 2012 - repeat in 10 years - Dr. Vira Agar  . Hyperlipidemia   . Migraine   . Psoriasis    Clobetasol     Past Surgical History:  Procedure Laterality Date  . ABDOMINAL HYSTERECTOMY    . BREAST CYST ASPIRATION Bilateral    multiple  times   . BREAST SURGERY    .  CHOLECYSTECTOMY    . TONSILLECTOMY  1975   Family History:  Family History  Problem Relation Age of Onset  . Hyperlipidemia Mother   . Hypertension Mother   . Heart disease Mother   . Alcohol abuse Father   . Cancer Father     breast  . Hyperlipidemia Father   . Depression Father   . Breast cancer Father 72  . Cancer Sister     2 sisters with breast cancer  . Alcohol abuse Brother   . Drug abuse Brother   . ADD / ADHD Daughter   . Bipolar disorder Daughter   . Depression Daughter   . Breast cancer Sister 74  . Breast cancer Sister 85  . Alcohol abuse Brother   . Drug abuse Brother   . Cancer Paternal Aunt     breast   . Breast cancer Paternal Aunt 47  . Hyperlipidemia Maternal Grandmother   . Hypertension Maternal Grandmother   . Breast cancer Cousin 28    female  . Breast cancer Paternal Aunt 93  . Breast cancer Paternal Aunt 69  . Breast cancer Paternal Aunt 32   Social History:  Social History   Social History  . Marital status: Married    Spouse name: N/A  . Number of children: 2  . Years  of education: N/A   Occupational History  . ER Tech     [employer]   Social History Main Topics  . Smoking status: Former Smoker    Packs/day: 1.00    Years: 35.00    Types: Cigarettes    Start date: 01/31/1966    Quit date: 02/01/1995  . Smokeless tobacco: Former Systems developer    Types: Chew    Quit date: 04/29/1989  . Alcohol use No  . Drug use: No  . Sexual activity: Not Currently   Other Topics Concern  . None   Social History Narrative   Hobbies: grandchildren, guitar   Caffeine: 1 cup daily   Exercise: Not at present   Diet: needs improvement   Additional History:    Assessment:   Musculoskeletal: Strength & Muscle Tone: within normal limits Gait & Station: normal Patient leans: N/A  Psychiatric Specialty Exam: Anxiety  Symptoms include insomnia. Patient reports no nervous/anxious behavior or suicidal ideas.    Medication Refill     Review of Systems   Psychiatric/Behavioral: Positive for depression ( it comes in 3 day periods each month). Negative for hallucinations, memory loss, substance abuse and suicidal ideas. The patient has insomnia. The patient is not nervous/anxious.   All other systems reviewed and are negative.   Blood pressure (!) 153/83, pulse 72, temperature 98 F (36.7 C), temperature source Oral, height 5\' 7"  (1.702 m), weight 179 lb 3.2 oz (81.3 kg).Body mass index is 28.07 kg/m.  General Appearance: Well Groomed  Eye Contact:  Good  Speech:  Normal Rate  Volume:  Normal  Mood:  Okay  Affect:  Congruent  Thought Process:  Linear  Orientation:  Full (Time, Place, and Person)  Thought Content:  Negative  Suicidal Thoughts:  No  Homicidal Thoughts:  No  Memory:  Immediate;   Good Recent;   Good Remote;   Good  Judgement:  Good  Insight:  Good  Psychomotor Activity:  Normal  Concentration:  Good  Recall:  Good  Fund of Knowledge: Good  Language: Good  Akathisia:  Negative  Handed:  Right  AIMS (if indicated):  Done 02/01/15 normal  Assets:  Communication Skills Desire for Improvement Vocational/Educational  ADL's:  Intact  Cognition: WNL  Sleep:  poor   Is the patient at risk to self?  No. Has the patient been a risk to self in the past 6 months?  No. Has the patient been a risk to self within the distant past?  Yes.   Is the patient a risk to others?  No. Has the patient been a risk to others in the past 6 months?  No. Has the patient been a risk to others within the distant past?  No.  Current Medications: Current Outpatient Prescriptions  Medication Sig Dispense Refill  . acetaminophen (TYLENOL) 325 MG tablet Take 325 mg by mouth every 6 (six) hours as needed.    . ALPRAZolam (XANAX) 0.25 MG tablet Take 1 tablet (0.25 mg total) by mouth 2 (two) times daily as needed for anxiety. 60 tablet 1  . aspirin 81 MG tablet Take 81 mg by mouth daily.    . Calcium Carb-Cholecalciferol (CALCIUM + D3 PO) Take  1 tablet by mouth daily.    . cetirizine (ZYRTEC) 10 MG tablet Take 10 mg by mouth daily.    . Cholecalciferol (VITAMIN D3) 2000 UNITS TABS Take 1 tablet by mouth daily.    . clobetasol cream (TEMOVATE) 0.05 % APPLY TOPICALLY 2 TIMES DAILY. [place]  g 1  . Cyanocobalamin (VITAMIN B-12 PO) Take 2,500 mcg by mouth daily.    Marland Kitchen NAPROXEN PO Take 1 tablet by mouth as needed.    . Omega-3 1000 MG CAPS Take 1 capsule twice daily 60 capsule 0  . omeprazole (PRILOSEC) 40 MG capsule TAKE 1 CAPSULE BY MOUTH DAILY. 90 capsule 3  . ondansetron (ZOFRAN) 4 MG tablet Take 1 tablet (4 mg total) by mouth every 8 (eight) hours as needed for nausea or vomiting. 20 tablet 0  . triamcinolone cream (KENALOG) 0.1 % APPLY TOPICALLY 2 TIMES DAILY. 30 g 1  . venlafaxine XR (EFFEXOR XR) 75 MG 24 hr capsule Take 1 capsule (75 mg total) by mouth daily at 3 pm. 90 capsule 1  . venlafaxine XR (EFFEXOR-XR) 150 MG 24 hr capsule Take 1 capsule (150 mg total) by mouth daily with breakfast. 90 capsule 1   No current facility-administered medications for this visit.     Medical Decision Making:  Established Problem, Worsening (2), Review of Medication Regimen & Side Effects (2) and Review of New Medication or Change in Dosage (2)  Treatment Plan Summary:Medication management and Plan Plan She will continue on Effexor XR 150  mg in the morning and 75 mg in the evening. She will continue on Xanax 0.25 mg twice daily as needed for anxiety. She reported that she does not take the medications on a regular basis. She will follow-up in 2 months or earlier depending on her symptoms    More than 50% of the time spent in psychoeducation, counseling and coordination of care.    This note was generated in part or whole with voice recognition software. Voice regonition is usually quite accurate but there are transcription errors that can and very often do occur. I apologize for any typographical errors that were not detected and corrected.    Rainey Pines, MD  12/20/2015, 1:18 PM

## 2016-01-12 DIAGNOSIS — R110 Nausea: Secondary | ICD-10-CM | POA: Insufficient documentation

## 2016-01-12 DIAGNOSIS — R197 Diarrhea, unspecified: Secondary | ICD-10-CM | POA: Insufficient documentation

## 2016-02-15 ENCOUNTER — Ambulatory Visit (INDEPENDENT_AMBULATORY_CARE_PROVIDER_SITE_OTHER): Payer: BC Managed Care – PPO | Admitting: Psychiatry

## 2016-02-15 ENCOUNTER — Encounter: Payer: Self-pay | Admitting: Psychiatry

## 2016-02-15 VITALS — BP 151/84 | HR 71 | Temp 98.3°F | Wt 175.8 lb

## 2016-02-15 DIAGNOSIS — F411 Generalized anxiety disorder: Secondary | ICD-10-CM

## 2016-02-15 DIAGNOSIS — F331 Major depressive disorder, recurrent, moderate: Secondary | ICD-10-CM | POA: Diagnosis not present

## 2016-02-15 MED ORDER — VENLAFAXINE HCL ER 150 MG PO CP24: 150.0000 mg | ORAL_CAPSULE | Freq: Every day | ORAL | 1 refills | Status: DC

## 2016-02-15 MED ORDER — ALPRAZOLAM 0.25 MG PO TABS: 0.2500 mg | ORAL_TABLET | Freq: Two times a day (BID) | ORAL | 1 refills | Status: DC | PRN

## 2016-02-15 MED ORDER — VENLAFAXINE HCL ER 75 MG PO CP24: 75.0000 mg | ORAL_CAPSULE | Freq: Every day | ORAL | 1 refills | Status: DC

## 2016-02-15 NOTE — Progress Notes (Signed)
[place] MD/PA/NP OP Progress Note  02/15/2016 10:12 AM Mikayla Cuevas  MRN:  [id number]  Subjective: Patient is a 60 year old female with history of depression who presented for follow-up appointment. She  reported that she has been feeling depressed for the past 2 weeks. 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 She reported first the date he died but then her husband also passed away. She is feeling sad for them. She is currently working in [employer] as a Quarry manager. She reported that she has been compliant with her medications. She takes Xanax twice a day. She currently denied having any adverse reactions from the medication.    She is not willing to change her medications at this time as she reported that Dr. Thurmond Butts has already tried her on several psychotropic medications in the past. She appeared calm and collected during the interview. She currently denied having any suicidal ideations or plans. She reported that the Xanax is helping her and she wants to continue taking it on a when necessary basis.  She is receptive to her medications and has been compliant.  Chief Complaint:  Chief Complaint    Follow-up; Medication Refill     Visit Diagnosis:   No diagnosis found.  Past Medical History:  Past Medical History:  Diagnosis Date  . Allergy   . Anxiety   . B12 deficiency   . Depression   . Diverticulitis   . GERD (gastroesophageal reflux disease)   . History of chicken pox   . History of colon polyps    Colonoscopy 2012 - repeat in 10 years - Dr. Vira Agar  . Hyperlipidemia   . Migraine   . Psoriasis    Clobetasol     Past Surgical History:  Procedure Laterality Date  . ABDOMINAL HYSTERECTOMY    . BREAST CYST ASPIRATION Bilateral    multiple  times   . BREAST SURGERY    . CHOLECYSTECTOMY    . TONSILLECTOMY  1975   Family History:  Family History  Problem Relation Age of Onset  .  Hyperlipidemia Mother   . Hypertension Mother   . Heart disease Mother   . Alcohol abuse Father   . Cancer Father     breast  . Hyperlipidemia Father   . Depression Father   . Breast cancer Father 101  . Cancer Sister     2 sisters with breast cancer  . Alcohol abuse Brother   . Drug abuse Brother   . ADD / ADHD Daughter   . Bipolar disorder Daughter   . Depression Daughter   . Breast cancer Sister 7  . Breast cancer Sister 86  . Alcohol abuse Brother   . Drug abuse Brother   . Cancer Paternal Aunt     breast   . Breast cancer Paternal Aunt 35  . Hyperlipidemia Maternal Grandmother   . Hypertension Maternal Grandmother   . Breast cancer Cousin 60    female  . Breast cancer Paternal Aunt 21  . Breast cancer Paternal Aunt 62  . Breast cancer Paternal Aunt 39   Social History:  Social History   Social History  . Marital status: Married    Spouse name: N/A  . Number of children: 2  . Years of education: N/A   Occupational History  . ER Tech     [employer]   Social History Main Topics  .  Smoking status: Former Smoker    Packs/day: 1.00    Years: 35.00    Types: Cigarettes    Start date: 01/31/1966    Quit date: 02/01/1995  . Smokeless tobacco: Former Systems developer    Types: Chew    Quit date: 04/29/1989  . Alcohol use No  . Drug use: No  . Sexual activity: Not Currently   Other Topics Concern  . None   Social History Narrative   Hobbies: grandchildren, guitar   Caffeine: 1 cup daily   Exercise: Not at present   Diet: needs improvement   Additional History:    Assessment:   Musculoskeletal: Strength & Muscle Tone: within normal limits Gait & Station: normal Patient leans: N/A  Psychiatric Specialty Exam: Medication Refill   Anxiety  Symptoms include insomnia. Patient reports no nervous/anxious behavior or suicidal ideas.      Review of Systems  Psychiatric/Behavioral: Negative for hallucinations, memory loss, substance abuse and suicidal ideas. Depression:   it comes in 3 day periods each month. The patient has insomnia. The patient is not nervous/anxious.   All other systems reviewed and are negative.   Blood pressure (!) 151/84, pulse 71, temperature 98.3 F (36.8 C), temperature source Oral, weight 175 lb 12.8 oz (79.7 kg).Body mass index is 27.53 kg/m.  General Appearance: Well Groomed  Eye Contact:  Good  Speech:  Normal Rate  Volume:  Normal  Mood:  Okay  Affect:  Congruent  Thought Process:  Linear  Orientation:  Full (Time, Place, and Person)  Thought Content:  Negative  Suicidal Thoughts:  No  Homicidal Thoughts:  No  Memory:  Immediate;   Good Recent;   Good Remote;   Good  Judgement:  Good  Insight:  Good  Psychomotor Activity:  Normal  Concentration:  Good  Recall:  Good  Fund of Knowledge: Good  Language: Good  Akathisia:  Negative  Handed:  Right  AIMS (if indicated):  Done 02/01/15 normal  Assets:  Communication Skills Desire for Improvement Vocational/Educational  ADL's:  Intact  Cognition: WNL  Sleep:  poor   Is the patient at risk to self?  No. Has the patient been a risk to self in the past 6 months?  No. Has the patient been a risk to self within the distant past?  Yes.   Is the patient a risk to others?  No. Has the patient been a risk to others in the past 6 months?  No. Has the patient been a risk to others within the distant past?  No.  Current Medications: Current Outpatient Prescriptions  Medication Sig Dispense Refill  . acetaminophen (TYLENOL) 325 MG tablet Take 325 mg by mouth every 6 (six) hours as needed.    . ALPRAZolam (XANAX) 0.25 MG tablet Take 1 tablet (0.25 mg total) by mouth 2 (two) times daily as needed for anxiety. 60 tablet 1  . aspirin 81 MG tablet Take 81 mg by mouth daily.    . Calcium Carb-Cholecalciferol (CALCIUM + D3 PO) Take 1 tablet by mouth daily.    . cetirizine (ZYRTEC) 10 MG tablet Take 10 mg by mouth daily.    . Cholecalciferol (VITAMIN D3) 2000 UNITS TABS Take 1  tablet by mouth daily.    . clobetasol cream (TEMOVATE) 0.05 % APPLY TOPICALLY 2 TIMES DAILY. 30 g 1  . Cyanocobalamin (VITAMIN B-12 PO) Take 2,500 mcg by mouth daily.    Marland Kitchen NAPROXEN PO Take 1 tablet by mouth as needed.    Marland Kitchen  Omega-3 1000 MG CAPS Take 1 capsule twice daily 60 capsule 0  . omeprazole (PRILOSEC) 40 MG capsule TAKE 1 CAPSULE BY MOUTH DAILY. 90 capsule 3  . ondansetron (ZOFRAN) 4 MG tablet Take 1 tablet (4 mg total) by mouth every 8 (eight) hours as needed for nausea or vomiting. 20 tablet 0  . triamcinolone cream (KENALOG) 0.1 % APPLY TOPICALLY 2 TIMES DAILY. 30 g 1  . venlafaxine XR (EFFEXOR XR) 75 MG 24 hr capsule Take 1 capsule (75 mg total) by mouth daily at 3 pm. 90 capsule 1  . venlafaxine XR (EFFEXOR-XR) 150 MG 24 hr capsule Take 1 capsule (150 mg total) by mouth daily with breakfast. 90 capsule 1   No current facility-administered medications for this visit.     Medical Decision Making:  Established Problem, Worsening (2), Review of Medication Regimen & Side Effects (2) and Review of New Medication or Change in Dosage (2)  Treatment Plan Summary:Medication management and Plan Plan She will continue on Effexor XR 150  mg in the morning and 75 mg in the evening. She will continue on Xanax 0.25 mg twice daily as needed for anxiety. She reported that she does not take the medications on a regular basis. She will follow-up in 2 months or earlier depending on her symptoms    More than 50% of the time spent in psychoeducation, counseling and coordination of care.    This note was generated in part or whole with voice recognition software. Voice regonition is usually quite accurate but there are transcription errors that can and very often do occur. I apologize for any typographical errors that were not detected and corrected.   Rainey Pines, MD  02/15/2016, 10:12 AM

## 2016-02-20 ENCOUNTER — Ambulatory Visit: Payer: BC Managed Care – PPO | Admitting: Psychiatry

## 2016-04-02 ENCOUNTER — Ambulatory Visit: Payer: BC Managed Care – PPO | Admitting: Anesthesiology

## 2016-04-02 ENCOUNTER — Encounter: Payer: Self-pay | Admitting: *Deleted

## 2016-04-02 ENCOUNTER — Ambulatory Visit
Admission: RE | Admit: 2016-04-02 | Discharge: 2016-04-02 | Disposition: A | Discharge status: Home / Self Care | Payer: BC Managed Care – PPO | Source: Ambulatory Visit | Attending: Unknown Physician Specialty | Admitting: Unknown Physician Specialty

## 2016-04-02 ENCOUNTER — Encounter
Admission: RE | Disposition: A | Discharge status: Home / Self Care | Payer: Self-pay | Source: Ambulatory Visit | Attending: Unknown Physician Specialty

## 2016-04-02 DIAGNOSIS — Z87891 Personal history of nicotine dependence: Secondary | ICD-10-CM | POA: Diagnosis not present

## 2016-04-02 DIAGNOSIS — K641 Second degree hemorrhoids: Secondary | ICD-10-CM | POA: Insufficient documentation

## 2016-04-02 DIAGNOSIS — F419 Anxiety disorder, unspecified: Secondary | ICD-10-CM | POA: Insufficient documentation

## 2016-04-02 DIAGNOSIS — E538 Deficiency of other specified B group vitamins: Secondary | ICD-10-CM | POA: Diagnosis not present

## 2016-04-02 DIAGNOSIS — K644 Residual hemorrhoidal skin tags: Secondary | ICD-10-CM | POA: Insufficient documentation

## 2016-04-02 DIAGNOSIS — K219 Gastro-esophageal reflux disease without esophagitis: Secondary | ICD-10-CM | POA: Insufficient documentation

## 2016-04-02 DIAGNOSIS — Z1211 Encounter for screening for malignant neoplasm of colon: Secondary | ICD-10-CM | POA: Insufficient documentation

## 2016-04-02 DIAGNOSIS — D125 Benign neoplasm of sigmoid colon: Secondary | ICD-10-CM | POA: Insufficient documentation

## 2016-04-02 DIAGNOSIS — K5730 Diverticulosis of large intestine without perforation or abscess without bleeding: Secondary | ICD-10-CM | POA: Diagnosis not present

## 2016-04-02 DIAGNOSIS — Z86010 Personal history of colonic polyps: Secondary | ICD-10-CM | POA: Diagnosis not present

## 2016-04-02 DIAGNOSIS — F329 Major depressive disorder, single episode, unspecified: Secondary | ICD-10-CM | POA: Insufficient documentation

## 2016-04-02 DIAGNOSIS — Z79899 Other long term (current) drug therapy: Secondary | ICD-10-CM | POA: Diagnosis not present

## 2016-04-02 DIAGNOSIS — E785 Hyperlipidemia, unspecified: Secondary | ICD-10-CM | POA: Diagnosis not present

## 2016-04-02 DIAGNOSIS — Z8371 Family history of colonic polyps: Secondary | ICD-10-CM | POA: Diagnosis not present

## 2016-04-02 DIAGNOSIS — D123 Benign neoplasm of transverse colon: Secondary | ICD-10-CM | POA: Diagnosis not present

## 2016-04-02 HISTORY — PX: COLONOSCOPY WITH PROPOFOL: SHX5780

## 2016-04-02 SURGERY — COLONOSCOPY WITH PROPOFOL
Anesthesia: General

## 2016-04-02 MED ORDER — GLYCOPYRROLATE 0.2 MG/ML IJ SOLN
INTRAMUSCULAR | Status: DC | PRN
  Administered 2016-04-02: 0.2 mg via INTRAVENOUS

## 2016-04-02 MED ORDER — SODIUM CHLORIDE 0.9 % IV SOLN: INTRAVENOUS | Status: DC

## 2016-04-02 MED ORDER — SODIUM CHLORIDE 0.9 % IV SOLN
INTRAVENOUS | Status: DC
  Administered 2016-04-02: 10:00:00 via INTRAVENOUS

## 2016-04-02 MED ORDER — PROPOFOL 10 MG/ML IV BOLUS
INTRAVENOUS | Status: DC | PRN
  Administered 2016-04-02: 20 mg via INTRAVENOUS
  Administered 2016-04-02 (×2): 50 mg via INTRAVENOUS
  Administered 2016-04-02: 80 mg via INTRAVENOUS

## 2016-04-02 MED ORDER — PROPOFOL 500 MG/50ML IV EMUL
INTRAVENOUS | Status: DC | PRN
  Administered 2016-04-02: 150 ug/kg/min via INTRAVENOUS

## 2016-04-02 NOTE — Transfer of Care (Signed)
Immediate Anesthesia Transfer of Care Note  Patient: Mikayla Cuevas  Procedure(s) Performed: Procedure(s): COLONOSCOPY WITH PROPOFOL (N/A)  Patient Location: PACU  Anesthesia Type:General  Level of Consciousness: awake, alert , oriented and patient cooperative  Airway & Oxygen Therapy: Patient Spontanous Breathing and Patient connected to nasal cannula oxygen  Post-op Assessment: Report given to RN and Post -op Vital signs reviewed and stable  Post vital signs: Reviewed and stable  Last Vitals:  Vitals:   04/02/16 0924 04/02/16 1122  BP: (!) 142/76 116/63  Pulse: 94 73  Resp: 16 (!) 21  Temp: 36.1 C 36.2 C    Last Pain:  Vitals:   04/02/16 1122  TempSrc: Tympanic         Complications: No apparent anesthesia complications

## 2016-04-02 NOTE — Op Note (Signed)
[facility] Gastroenterology Patient Name: Mikayla Cuevas Procedure Date: 04/02/2016 10:46 AM MRN: [id number] Account #: [id number] Date of Birth: 03-05-1956 Admit Type: Outpatient Age: 60 Room: [facility] ENDO ROOM 4 Gender: Female Note Status: Finalized Procedure:            Colonoscopy Indications:          Colon cancer screening in patient at increased risk:                        Family history of 1st-degree relative with colon polyps Providers:            Manya Silvas, MD Referring MD:         Deborra Medina, MD (Referring MD) Medicines:            Propofol per Anesthesia Complications:        No immediate complications. Procedure:            Pre-Anesthesia Assessment:                       - After reviewing the risks and benefits, the patient                        was deemed in satisfactory condition to undergo the                        procedure.                       After obtaining informed consent, the colonoscope was                        passed under direct vision. Throughout the procedure,                        the patient's blood pressure, pulse, and oxygen                        saturations were monitored continuously. The                        Colonoscope was introduced through the anus and                        advanced to the the cecum, identified by appendiceal                        orifice and ileocecal valve. The colonoscopy was                        performed without difficulty. The patient tolerated the                        procedure well. The quality of the bowel preparation                        was good. Findings:      A small polyp was found in the transverse colon. The polyp was flat and       sessile. The polyp was removed with several bites of the jumbo cold       forceps. Resection and retrieval were  complete.      A small polyp was found in the sigmoid colon. The polyp was sessile. The       polyp was removed with a hot  snare. Resection and retrieval were       complete.      External and internal hemorrhoids were found during perianal exam. The       hemorrhoids were medium-sized and Grade II (internal hemorrhoids that       prolapse but reduce spontaneously). There was a relatively medium-large       thrombosed external hemorrhoid. Impression:           - One small polyp in the transverse colon, removed with                        a jumbo cold forceps. Resected and retrieved.                       - One small polyp in the sigmoid colon, removed with a                        hot snare. Resected and retrieved.                       - External and internal hemorrhoids. Recommendation:       - Await pathology results. Manya Silvas, MD 04/02/2016 11:23:34 AM This report has been signed electronically. Number of Addenda: 0 Note Initiated On: 04/02/2016 10:46 AM Scope Withdrawal Time: 0 hours 20 minutes 52 seconds  Total Procedure Duration: 0 hours 27 minutes 46 seconds       [facility]

## 2016-04-02 NOTE — H&P (Signed)
Primary Care Physician:  Crecencio Mc, MD Primary Gastroenterologist:  Dr. Vira Agar  Pre-Procedure History & Physical: HPI:  Mikayla Cuevas is a 60 y.o. female is here for an colonoscopy.   Past Medical History:  Diagnosis Date  . Allergy   . Anxiety   . B12 deficiency   . Depression   . Diverticulitis   . GERD (gastroesophageal reflux disease)   . History of chicken pox   . History of colon polyps    Colonoscopy 2012 - repeat in 10 years - Dr. Vira Agar  . Hyperlipidemia   . Migraine   . Psoriasis    Clobetasol     Past Surgical History:  Procedure Laterality Date  . ABDOMINAL HYSTERECTOMY    . BREAST CYST ASPIRATION Bilateral    multiple  times   . BREAST SURGERY    . CHOLECYSTECTOMY    . TONSILLECTOMY  1975    Prior to Admission medications   Medication Sig Start Date End Date Taking? Authorizing Provider  Calcium Carb-Cholecalciferol (CALCIUM + D3 PO) Take 1 tablet by mouth daily.   Yes Historical Provider, MD  cetirizine (ZYRTEC) 10 MG tablet Take 10 mg by mouth daily.   Yes Historical Provider, MD  Cholecalciferol (VITAMIN D3) 2000 UNITS TABS Take 1 tablet by mouth daily.   Yes Historical Provider, MD  Cyanocobalamin (VITAMIN B-12 PO) Take 2,500 mcg by mouth daily.   Yes Historical Provider, MD  Omega-3 1000 MG CAPS Take 1 capsule twice daily 10/28/13  Yes Raquel Dagoberto Ligas, NP  omeprazole (PRILOSEC) 40 MG capsule TAKE 1 CAPSULE BY MOUTH DAILY. 07/26/15  Yes Crecencio Mc, MD  venlafaxine XR (EFFEXOR XR) 75 MG 24 hr capsule Take 1 capsule (75 mg total) by mouth daily at 3 pm. 02/15/16  Yes Rainey Pines, MD  venlafaxine XR (EFFEXOR-XR) 150 MG 24 hr capsule Take 1 capsule (150 mg total) by mouth daily with breakfast. 02/15/16  Yes Rainey Pines, MD  acetaminophen (TYLENOL) 325 MG tablet Take 325 mg by mouth every 6 (six) hours as needed.    Historical Provider, MD  ALPRAZolam Duanne Moron) 0.25 MG tablet Take 1 tablet (0.25 mg total) by mouth 2 (two) times daily as needed for  anxiety. 02/15/16   Rainey Pines, MD  aspirin 81 MG tablet Take 81 mg by mouth daily.    Historical Provider, MD  clobetasol cream (TEMOVATE) 0.05 % APPLY TOPICALLY 2 TIMES DAILY. 07/26/15   Crecencio Mc, MD  NAPROXEN PO Take 1 tablet by mouth as needed.    Historical Provider, MD  ondansetron (ZOFRAN) 4 MG tablet Take 1 tablet (4 mg total) by mouth every 8 (eight) hours as needed for nausea or vomiting. 04/04/15   Crecencio Mc, MD  triamcinolone cream (KENALOG) 0.1 % APPLY TOPICALLY 2 TIMES DAILY. 07/26/15   Crecencio Mc, MD    Allergies as of 03/19/2016 - Review Complete 02/15/2016  Allergen Reaction Noted  . Morphine and related Other (See Comments) 09/07/2014  . Statins Other (See Comments) 09/07/2014    Family History  Problem Relation Age of Onset  . Hyperlipidemia Mother   . Hypertension Mother   . Heart disease Mother   . Alcohol abuse Father   . Cancer Father     breast  . Hyperlipidemia Father   . Depression Father   . Breast cancer Father 41  . Cancer Sister     2 sisters with breast cancer  . Alcohol abuse Brother   .  Drug abuse Brother   . ADD / ADHD Daughter   . Bipolar disorder Daughter   . Depression Daughter   . Breast cancer Sister 23  . Breast cancer Sister 64  . Alcohol abuse Brother   . Drug abuse Brother   . Cancer Paternal Aunt     breast   . Breast cancer Paternal Aunt 44  . Hyperlipidemia Maternal Grandmother   . Hypertension Maternal Grandmother   . Breast cancer Cousin 53    female  . Breast cancer Paternal Aunt 58  . Breast cancer Paternal Aunt 1  . Breast cancer Paternal Aunt 42    Social History   Social History  . Marital status: Married    Spouse name: N/A  . Number of children: 2  . Years of education: N/A   Occupational History  . ER Tech     [employer]   Social History Main Topics  . Smoking status: Former Smoker    Packs/day: 1.00    Years: 35.00    Types: Cigarettes    Start date: 01/31/1966    Quit date: 02/01/1995  .  Smokeless tobacco: Former Systems developer    Types: Chew    Quit date: 04/29/1989  . Alcohol use No  . Drug use: No  . Sexual activity: Not Currently   Other Topics Concern  . Not on file   Social History Narrative   Hobbies: grandchildren, guitar   Caffeine: 1 cup daily   Exercise: Not at present   Diet: needs improvement    Review of Systems: See HPI, otherwise negative ROS  Physical Exam: BP (!) 142/76   Pulse 94   Temp 97 F (36.1 C) (Tympanic)   Resp 16   Ht 5\' 7"  (1.702 m)   Wt 79.4 kg (175 lb)   SpO2 97%   BMI 27.41 kg/m  General:   Alert,  pleasant and cooperative in NAD Head:  Normocephalic and atraumatic. Neck:  Supple; no masses or thyromegaly. Lungs:  Clear throughout to auscultation.    Heart:  Regular rate and rhythm. Abdomen:  Soft, nontender and nondistended. Normal bowel sounds, without guarding, and without rebound.   Neurologic:  Alert and  oriented x4;  grossly normal neurologically.  Impression/Plan: Mikayla Cuevas is here for an colonoscopy to be performed for FH colon polyps  Risks, benefits, limitations, and alternatives regarding  colonoscopy have been reviewed with the patient.  Questions have been answered.  All parties agreeable.   Gaylyn Cheers, MD  04/02/2016, 10:45 AM

## 2016-04-02 NOTE — Anesthesia Preprocedure Evaluation (Signed)
Anesthesia Evaluation  Patient identified by MRN, date of birth, ID band Patient awake    Reviewed: Allergy & Precautions, H&P , NPO status , Patient's Chart, lab work & pertinent test results  History of Anesthesia Complications Negative for: history of anesthetic complications  Airway Mallampati: III  TM Distance: <3 FB Neck ROM: full    Dental  (+) Poor Dentition, Chipped   Pulmonary neg shortness of breath, former smoker,    Pulmonary exam normal breath sounds clear to auscultation       Cardiovascular Exercise Tolerance: Good (-) angina+ Peripheral Vascular Disease  (-) Past MI and (-) DOE Normal cardiovascular exam Rhythm:regular Rate:Normal     Neuro/Psych  Headaches, PSYCHIATRIC DISORDERS Anxiety Depression  Neuromuscular disease    GI/Hepatic Neg liver ROS, GERD  Controlled,  Endo/Other  negative endocrine ROS  Renal/GU negative Renal ROS  negative genitourinary   Musculoskeletal   Abdominal   Peds  Hematology negative hematology ROS (+)   Anesthesia Other Findings Past Medical History: No date: Allergy No date: Anxiety No date: B12 deficiency No date: Depression No date: Diverticulitis No date: GERD (gastroesophageal reflux disease) No date: History of chicken pox No date: History of colon polyps     Comment: Colonoscopy 2012 - repeat in 67 years - Dr.               Vira Agar No date: Hyperlipidemia No date: Migraine No date: Psoriasis     Comment: Clobetasol   Past Surgical History: No date: ABDOMINAL HYSTERECTOMY No date: BREAST CYST ASPIRATION Bilateral     Comment: multiple  times  No date: BREAST SURGERY No date: CHOLECYSTECTOMY 1975: TONSILLECTOMY  BMI    Body Mass Index:  27.41 kg/m      Reproductive/Obstetrics negative OB ROS                             Anesthesia Physical Anesthesia Plan  ASA: III  Anesthesia Plan: General   Post-op Pain  Management:    Induction:   Airway Management Planned:   Additional Equipment:   Intra-op Plan:   Post-operative Plan:   Informed Consent: I have reviewed the patients History and Physical, chart, labs and discussed the procedure including the risks, benefits and alternatives for the proposed anesthesia with the patient or authorized representative who has indicated his/her understanding and acceptance.   Dental Advisory Given  Plan Discussed with: Anesthesiologist, CRNA and Surgeon  Anesthesia Plan Comments:         Anesthesia Quick Evaluation

## 2016-04-03 ENCOUNTER — Encounter: Payer: Self-pay | Admitting: Unknown Physician Specialty

## 2016-04-03 LAB — SURGICAL PATHOLOGY

## 2016-04-03 NOTE — Anesthesia Postprocedure Evaluation (Signed)
Anesthesia Post Note  Patient: Mikayla Cuevas  Procedure(s) Performed: Procedure(s) (LRB): COLONOSCOPY WITH PROPOFOL (N/A)  Patient location during evaluation: Endoscopy Anesthesia Type: General Level of consciousness: awake and alert Pain management: pain level controlled Vital Signs Assessment: post-procedure vital signs reviewed and stable Respiratory status: spontaneous breathing, nonlabored ventilation, respiratory function stable and patient connected to nasal cannula oxygen Cardiovascular status: blood pressure returned to baseline and stable Postop Assessment: no signs of nausea or vomiting Anesthetic complications: no    Last Vitals:  Vitals:   04/02/16 1142 04/02/16 1152  BP: 128/78 133/67  Pulse: 65 (!) 58  Resp: 14 11  Temp:      Last Pain:  Vitals:   04/02/16 1122  TempSrc: Tympanic                 Precious Haws Vandora Jaskulski

## 2016-04-04 ENCOUNTER — Ambulatory Visit: Payer: BC Managed Care – PPO | Admitting: Psychiatry

## 2016-04-26 ENCOUNTER — Ambulatory Visit (INDEPENDENT_AMBULATORY_CARE_PROVIDER_SITE_OTHER): Payer: BC Managed Care – PPO | Admitting: Psychiatry

## 2016-04-26 ENCOUNTER — Encounter: Payer: Self-pay | Admitting: Psychiatry

## 2016-04-26 VITALS — BP 150/94 | HR 83 | Temp 97.8°F | Wt 180.2 lb

## 2016-04-26 DIAGNOSIS — F411 Generalized anxiety disorder: Secondary | ICD-10-CM | POA: Diagnosis not present

## 2016-04-26 DIAGNOSIS — F331 Major depressive disorder, recurrent, moderate: Secondary | ICD-10-CM

## 2016-04-26 MED ORDER — VENLAFAXINE HCL ER 75 MG PO CP24: 75.0000 mg | ORAL_CAPSULE | Freq: Every day | ORAL | 1 refills | Status: DC

## 2016-04-26 MED ORDER — VENLAFAXINE HCL ER 150 MG PO CP24: 150.0000 mg | ORAL_CAPSULE | Freq: Every day | ORAL | 1 refills | Status: DC

## 2016-04-26 NOTE — Progress Notes (Signed)
[facility] MD/PA/NP OP Progress Note  04/26/2016 11:48 AM Mikayla Cuevas  MRN:  [id number]  Subjective: Patient is a 61 year old female with history of depression who presented for follow-up appointment. She  reported that she was sick before the holidays and was having upper respiratory infection. She reported that finally she is getting better. She reported that she has been compliant with her medications. She has completely stopped taking the Xanax and has been taking Effexor on a regular basis. 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 She was discussing in detail about the same. We also discussed about her medications. She reported that she is compliant with them. She reported that she has a strong family history of breast cancer as her father was diagnosed with the same. She reported that she has already been tested for the same and her genes is negative. However she has been getting her mammogram on a regular basis. She currently denied having any symptoms.   Patient appeared calm and alert during the interview. She denied having any suicidal ideations or plans.     She is receptive to her medications and has been compliant.  Chief Complaint:  Chief Complaint    Follow-up; Medication Refill     Visit Diagnosis:     ICD-9-CM ICD-10-CM   1. MDD (major depressive disorder), recurrent episode, moderate (HCC) 296.32 F33.1   2. Anxiety state 300.00 F41.1     Past Medical History:  Past Medical History:  Diagnosis Date  . Allergy   . Anxiety   . B12 deficiency   . Depression   . Diverticulitis   . GERD (gastroesophageal reflux disease)   . History of chicken pox   . History of colon polyps    Colonoscopy 2012 - repeat in 10 years - Dr. Vira Agar  . Hyperlipidemia   . Migraine   . Psoriasis    Clobetasol     Past Surgical History:  Procedure Laterality Date  . ABDOMINAL HYSTERECTOMY    . BREAST CYST ASPIRATION Bilateral    multiple  times    . BREAST SURGERY    . CHOLECYSTECTOMY    . COLONOSCOPY WITH PROPOFOL N/A 04/02/2016   Procedure: COLONOSCOPY WITH PROPOFOL;  Surgeon: Manya Silvas, MD;  Location: [facility] ENDOSCOPY;  Service: Endoscopy;  Laterality: N/A;  . TONSILLECTOMY  1975   Family History:  Family History  Problem Relation Age of Onset  . Hyperlipidemia Mother   . Hypertension Mother   . Heart disease Mother   . Alcohol abuse Father   . Cancer Father     breast  . Hyperlipidemia Father   . Depression Father   . Breast cancer Father 53  . Cancer Sister     2 sisters with breast cancer  . Alcohol abuse Brother   . Drug abuse Brother   . ADD / ADHD Daughter   . Bipolar disorder Daughter   . Depression Daughter   . Breast cancer Sister 54  . Breast cancer Sister 17  . Alcohol abuse Brother   . Drug abuse Brother   . Cancer Paternal Aunt     breast   . Breast cancer Paternal Aunt 37  . Hyperlipidemia Maternal Grandmother   . Hypertension Maternal Grandmother   . Breast cancer Cousin 81    female  . Breast cancer Paternal Aunt 51  . Breast cancer Paternal Aunt 10  . Breast cancer Paternal Aunt 61   Social History:  Social History   Social History  . Marital status: Married    Spouse name: N/A  . Number of children: 2  . Years of education: N/A   Occupational History  . ER Tech     [employer]   Social History Main Topics  . Smoking status: Former Smoker    Packs/day: 1.00    Years: 35.00    Types: Cigarettes    Start date: 01/31/1966    Quit date: 02/01/1995  . Smokeless tobacco: Former Systems developer    Types: Chew    Quit date: 04/29/1989  . Alcohol use No  . Drug use: No  . Sexual activity: Not Currently   Other Topics Concern  . None   Social History Narrative   Hobbies: grandchildren, guitar   Caffeine: 1 cup daily   Exercise: Not at present   Diet: needs improvement   Additional History:    Assessment:   Musculoskeletal: Strength & Muscle Tone: within normal limits Gait & Station:  normal Patient leans: N/A  Psychiatric Specialty Exam: Medication Refill   Anxiety  Symptoms include insomnia. Patient reports no nervous/anxious behavior or suicidal ideas.      Review of Systems  Psychiatric/Behavioral: Negative for hallucinations, memory loss, substance abuse and suicidal ideas. Depression:  it comes in 3 day periods each month. The patient has insomnia. The patient is not nervous/anxious.   All other systems reviewed and are negative.   Blood pressure (!) 150/94, pulse 83, temperature 97.8 F (36.6 C), temperature source Oral, weight 180 lb 3.2 oz (81.7 kg).Body mass index is 28.22 kg/m.  General Appearance: Well Groomed  Eye Contact:  Good  Speech:  Normal Rate  Volume:  Normal  Mood:  Okay  Affect:  Congruent  Thought Process:  Linear  Orientation:  Full (Time, Place, and Person)  Thought Content:  Negative  Suicidal Thoughts:  No  Homicidal Thoughts:  No  Memory:  Immediate;   Good Recent;   Good Remote;   Good  Judgement:  Good  Insight:  Good  Psychomotor Activity:  Normal  Concentration:  Good  Recall:  Good  Fund of Knowledge: Good  Language: Good  Akathisia:  Negative  Handed:  Right  AIMS (if indicated):  Done 02/01/15 normal  Assets:  Communication Skills Desire for Improvement Vocational/Educational  ADL's:  Intact  Cognition: WNL  Sleep:  poor   Is the patient at risk to self?  No. Has the patient been a risk to self in the past 6 months?  No. Has the patient been a risk to self within the distant past?  Yes.   Is the patient a risk to others?  No. Has the patient been a risk to others in the past 6 months?  No. Has the patient been a risk to others within the distant past?  No.  Current Medications: Current Outpatient Prescriptions  Medication Sig Dispense Refill  . acetaminophen (TYLENOL) 325 MG tablet Take 325 mg by mouth every 6 (six) hours as needed.    . ALPRAZolam (XANAX) 0.25 MG tablet Take 1 tablet (0.25 mg total)  by mouth 2 (two) times daily as needed for anxiety. 60 tablet 1  . aspirin 81 MG tablet Take 81 mg by mouth daily.    . Calcium Carb-Cholecalciferol (CALCIUM + D3 PO) Take 1 tablet by mouth daily.    . cetirizine (ZYRTEC) 10 MG tablet Take 10 mg by mouth daily.    . Cholecalciferol (VITAMIN D3) 2000 UNITS TABS Take 1  tablet by mouth daily.    . clobetasol cream (TEMOVATE) 0.05 % APPLY TOPICALLY 2 TIMES DAILY. 30 g 1  . Cyanocobalamin (VITAMIN B-12 PO) Take 2,500 mcg by mouth daily.    Marland Kitchen NAPROXEN PO Take 1 tablet by mouth as needed.    . Omega-3 1000 MG CAPS Take 1 capsule twice daily 60 capsule 0  . omeprazole (PRILOSEC) 40 MG capsule TAKE 1 CAPSULE BY MOUTH DAILY. 90 capsule 3  . triamcinolone cream (KENALOG) 0.1 % APPLY TOPICALLY 2 TIMES DAILY. 30 g 1  . venlafaxine XR (EFFEXOR XR) 75 MG 24 hr capsule Take 1 capsule (75 mg total) by mouth daily at 3 pm. 90 capsule 1  . venlafaxine XR (EFFEXOR-XR) 150 MG 24 hr capsule Take 1 capsule (150 mg total) by mouth daily with breakfast. 90 capsule 1  . ondansetron (ZOFRAN) 4 MG tablet Take 1 tablet (4 mg total) by mouth every 8 (eight) hours as needed for nausea or vomiting. 20 tablet 0   No current facility-administered medications for this visit.     Medical Decision Making:  Established Problem, Worsening (2), Review of Medication Regimen & Side Effects (2) and Review of New Medication or Change in Dosage (2)  Treatment Plan Summary:Medication management and Plan Plan She will continue on Effexor XR 150  mg in the morning and 75 mg in the evening. She will continue on Xanax 0.25 mg twice daily as needed for anxiety. She reported that she does not take the medications on a regular basis. She will follow-up in 2 months or earlier depending on her symptoms    More than 50% of the time spent in psychoeducation, counseling and coordination of care.    This note was generated in part or whole with voice recognition software. Voice regonition is  usually quite accurate but there are transcription errors that can and very often do occur. I apologize for any typographical errors that were not detected and corrected.   Rainey Pines, MD  04/26/2016, 11:48 AM

## 2016-05-28 ENCOUNTER — Encounter: Payer: Self-pay | Admitting: Internal Medicine

## 2016-05-28 ENCOUNTER — Ambulatory Visit (INDEPENDENT_AMBULATORY_CARE_PROVIDER_SITE_OTHER): Payer: BC Managed Care – PPO | Admitting: Internal Medicine

## 2016-05-28 DIAGNOSIS — L03114 Cellulitis of left upper limb: Secondary | ICD-10-CM

## 2016-05-28 DIAGNOSIS — L247 Irritant contact dermatitis due to plants, except food: Secondary | ICD-10-CM | POA: Diagnosis not present

## 2016-05-28 MED ORDER — CEPHALEXIN 500 MG PO CAPS
500.0000 mg | ORAL_CAPSULE | Freq: Four times a day (QID) | ORAL | 0 refills | Status: DC
Start: 2016-05-28 — End: 2016-06-25

## 2016-05-28 MED ORDER — PREDNISONE 10 MG PO TABS
ORAL_TABLET | ORAL | 0 refills | Status: DC
Start: 2016-05-28 — End: 2016-06-25

## 2016-05-28 NOTE — Progress Notes (Signed)
Subjective:  Patient ID: Mikayla Cuevas, female    DOB: 06/09/1955  Age: 61 y.o. MRN: [id number]  CC: Diagnoses of Cellulitis of left arm and Contact dermatitis and eczema due to plant were pertinent to this visit.  HPI STORMI GOUDEAU presents for contact dermatitis involving the left forearm.  Symptoms started on Friday after tripping in the yard and falling into a patch of  vine that she identified as either poison oak or ivy.     Has been applying a commercial topical "scrub"  that is designed to extract the oil out of the scratches,  In spite of this she has  developed blistering lesions on her left forearm that have become itching and draining,  She has been covering her arm with gauze while at work.  But every time she gets hot and sweaty it flares.    Outpatient Medications Prior to Visit  Medication Sig Dispense Refill  . acetaminophen (TYLENOL) 325 MG tablet Take 325 mg by mouth every 6 (six) hours as needed.    . ALPRAZolam (XANAX) 0.25 MG tablet Take 1 tablet (0.25 mg total) by mouth 2 (two) times daily as needed for anxiety. 60 tablet 1  . aspirin 81 MG tablet Take 81 mg by mouth daily.    . Calcium Carb-Cholecalciferol (CALCIUM + D3 PO) Take 1 tablet by mouth daily.    . cetirizine (ZYRTEC) 10 MG tablet Take 10 mg by mouth daily.    . Cholecalciferol (VITAMIN D3) 2000 UNITS TABS Take 1 tablet by mouth daily.    . clobetasol cream (TEMOVATE) 0.05 % APPLY TOPICALLY 2 TIMES DAILY. 30 g 1  . Cyanocobalamin (VITAMIN B-12 PO) Take 2,500 mcg by mouth daily.    Marland Kitchen NAPROXEN PO Take 1 tablet by mouth as needed.    . Omega-3 1000 MG CAPS Take 1 capsule twice daily 60 capsule 0  . omeprazole (PRILOSEC) 40 MG capsule TAKE 1 CAPSULE BY MOUTH DAILY. 90 capsule 3  . ondansetron (ZOFRAN) 4 MG tablet Take 1 tablet (4 mg total) by mouth every 8 (eight) hours as needed for nausea or vomiting. 20 tablet 0  . triamcinolone cream (KENALOG) 0.1 % APPLY TOPICALLY 2 TIMES DAILY. 30 g 1  . venlafaxine  XR (EFFEXOR XR) 75 MG 24 hr capsule Take 1 capsule (75 mg total) by mouth daily at 3 pm. 90 capsule 1  . venlafaxine XR (EFFEXOR-XR) 150 MG 24 hr capsule Take 1 capsule (150 mg total) by mouth daily with breakfast. 90 capsule 1   No facility-administered medications prior to visit.     Review of Systems;  Patient denies headache, fevers, malaise, unintentional weight loss, skin rash, eye pain, sinus congestion and sinus pain, sore throat, dysphagia,  hemoptysis , cough, dyspnea, wheezing, chest pain, palpitations, orthopnea, edema, abdominal pain, nausea, melena, diarrhea, constipation, flank pain, dysuria, hematuria, urinary  Frequency, nocturia, numbness, tingling, seizures,  Focal weakness, Loss of consciousness,  Tremor, insomnia, depression, anxiety, and suicidal ideation.      Objective:  BP (!) 152/102   Pulse 77   Temp 97.4 F (36.3 C) (Oral)   Wt 179 lb 6.4 oz (81.4 kg)   PF 98 L/min   BMI 28.10 kg/m   BP Readings from Last 3 Encounters:  05/28/16 (!) 152/102  04/02/16 133/67  04/04/15 140/82    Wt Readings from Last 3 Encounters:  05/28/16 179 lb 6.4 oz (81.4 kg)  04/02/16 175 lb (79.4 kg)  04/04/15 174 lb 8 oz (  79.2 kg)    General appearance: alert, cooperative and appears stated age Neck: no adenopathy, no carotid bruit, supple, symmetrical, trachea midline and thyroid not enlarged, symmetric, no tenderness/mass/nodules Back: symmetric, no curvature. ROM normal. No CVA tenderness. Lungs: clear to auscultation bilaterally Heart: regular rate and rhythm, S1, S2 normal, no murmur, click, rub or gallop Skin: left forearm covered with blistering lesions drainign clear fluid.  Surrounded by erythema consistent with cellulitis.  Lymph nodes: Cervical, supraclavicular, and axillary nodes normal.  No results found for: HGBA1C  Lab Results  Component Value Date   CREATININE 0.9 04/08/2014   CREATININE 0.8 10/28/2013   CREATININE 0.85 03/16/2013    Lab Results    Component Value Date   WBC 8.9 04/08/2014   HGB 12.8 04/08/2014   HCT 40.2 04/08/2014   PLT 303.0 04/08/2014   GLUCOSE 91 04/08/2014   CHOL 284 (H) 01/17/2015   TRIG 137.0 01/17/2015   HDL 44.10 01/17/2015   LDLDIRECT 222.9 04/08/2014   LDLCALC 212 (H) 01/17/2015   ALT 16 04/08/2014   AST 17 04/08/2014   NA 139 04/08/2014   K 4.0 04/08/2014   CL 102 04/08/2014   CREATININE 0.9 04/08/2014   BUN 16 04/08/2014   CO2 26 04/08/2014   TSH 1.20 01/17/2015    No results found.  Assessment & Plan:   Problem List Items Addressed This Visit    Cellulitis of left arm    Complication of contact dermatitis.  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  Zyrtec for daytime itching, benadryl for nocturnal use.          I am having Ms. Macknight start on predniSONE and cephALEXin. I am also having her maintain her aspirin, Calcium Carb-Cholecalciferol (CALCIUM + D3 PO), Vitamin D3, Cyanocobalamin (VITAMIN B-12 PO), Omega-3, acetaminophen, cetirizine, NAPROXEN PO, ondansetron, triamcinolone cream, clobetasol cream, omeprazole, ALPRAZolam, venlafaxine XR, and venlafaxine XR.  Meds ordered this encounter  Medications  . predniSONE (DELTASONE) 10 MG tablet    Sig: 6 tablets daily x 3,  Reduce 1 tablet every 2 days    Dispense:  50 tablet    Refill:  0  . cephALEXin (KEFLEX) 500 MG capsule    Sig: Take 1 capsule (500 mg total) by mouth 4 (four) times daily.    Dispense:  28 capsule    Refill:  0    There are no discontinued medications.  Follow-up: No Follow-up on file.   Crecencio Mc, MD

## 2016-05-28 NOTE — Progress Notes (Signed)
Pre visit review using our clinic review tool, if applicable. No additional management support is needed unless otherwise documented below in the visit note. 

## 2016-05-28 NOTE — Patient Instructions (Signed)
Prednisone 60 mg daily for 3 days, then 50 mg daily for 2 days,  Then 40 mg daily x 2 days etc etc  Keflex 4 times daily   for 7 days  (for infection )   Try allegra, zyrtec  or  claritin  for itching   Taking an antibiotic can create an imbalance in the normal population of bacteria that live in the small intestine.  This imbalance can persist for 3 months.   Taking a probiotic ( Align, Floraque or Culturelle), the generic version of one of these over the counter medications, or an alternative form (kombucha,  Yogurt, or another dietary source) for a minimum of 3 weeks may help prevent a serious antibiotic associated diarrhea  Called clostridium dificile colitis that occurs when the bacteria population is altered .  Taking a probiotic may also prevent vaginitis due to yeast infections and can be continued indefinitely if you feel that it improves your digestion or your elimination (bowels).  [facility] [employer]  Or Activia  make the best yogurts

## 2016-05-29 DIAGNOSIS — L247 Irritant contact dermatitis due to plants, except food: Secondary | ICD-10-CM | POA: Insufficient documentation

## 2016-05-29 DIAGNOSIS — L03114 Cellulitis of left upper limb: Secondary | ICD-10-CM | POA: Insufficient documentation

## 2016-05-29 NOTE — Assessment & Plan Note (Signed)
Complication of contact dermatitis.  Keflex qid  7 days  Probiotic advised

## 2016-05-29 NOTE — Assessment & Plan Note (Signed)
Prednisone 60 mg daily x 3,  Then tapering dose for a total of 13 days.  Zyrtec for daytime itching, benadryl for nocturnal use.

## 2016-06-25 ENCOUNTER — Ambulatory Visit (INDEPENDENT_AMBULATORY_CARE_PROVIDER_SITE_OTHER): Payer: BC Managed Care – PPO | Admitting: Psychiatry

## 2016-06-25 ENCOUNTER — Encounter: Payer: Self-pay | Admitting: Psychiatry

## 2016-06-25 VITALS — BP 142/88 | HR 92 | Temp 97.7°F | Wt 178.6 lb

## 2016-06-25 DIAGNOSIS — F411 Generalized anxiety disorder: Secondary | ICD-10-CM

## 2016-06-25 DIAGNOSIS — F331 Major depressive disorder, recurrent, moderate: Secondary | ICD-10-CM

## 2016-06-25 MED ORDER — VENLAFAXINE HCL ER 150 MG PO CP24: 150.0000 mg | ORAL_CAPSULE | Freq: Every day | ORAL | 1 refills | Status: DC

## 2016-06-25 MED ORDER — VENLAFAXINE HCL ER 75 MG PO CP24
75.0000 mg | ORAL_CAPSULE | Freq: Every day | ORAL | 1 refills | Status: DC
Start: 2016-06-25 — End: 2016-09-24

## 2016-06-25 NOTE — Progress Notes (Signed)
BH MD/PA/NP OP Progress Note  06/25/2016 8:56 AM Mikayla Cuevas  MRN:  [id number]  Subjective: Patient is a 61 year old female with history of depression who presented for follow-up appointment. She reported that she has been doing well. She reported that she has been taking her medications as prescribed. She is currently having allergies related to the seasonal change. She stated that she is taking over-the-counter medications for the same. 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 She spends her days off with her grandchildren. Patient reported that she sleeps well at night and has not been taking Xanax on a regular basis. She denied having any suicidal ideations or plans. She appeared calm and alert during the interview.   She is receptive to her medications and has been compliant.  Chief Complaint:  Chief Complaint    Follow-up; Medication Refill     Visit Diagnosis:   No diagnosis found.  Past Medical History:  Past Medical History:  Diagnosis Date  . Allergy   . Anxiety   . B12 deficiency   . Depression   . Diverticulitis   . GERD (gastroesophageal reflux disease)   . History of chicken pox   . History of colon polyps    Colonoscopy 2012 - repeat in 10 years - Dr. Vira Agar  . Hyperlipidemia   . Migraine   . Psoriasis    Clobetasol     Past Surgical History:  Procedure Laterality Date  . ABDOMINAL HYSTERECTOMY    . BREAST CYST ASPIRATION Bilateral    multiple  times   . BREAST SURGERY    . CHOLECYSTECTOMY    . COLONOSCOPY WITH PROPOFOL N/A 04/02/2016   Procedure: COLONOSCOPY WITH PROPOFOL;  Surgeon: Manya Silvas, MD;  Location: [facility] ENDOSCOPY;  Service: Endoscopy;  Laterality: N/A;  . TONSILLECTOMY  1975   Family History:  Family History  Problem Relation Age of Onset  . Hyperlipidemia Mother   . Hypertension Mother   . Heart disease Mother   . Alcohol abuse Father   . Cancer Father     breast  . Hyperlipidemia Father   .  Depression Father   . Breast cancer Father 72  . Cancer Sister     2 sisters with breast cancer  . Alcohol abuse Brother   . Drug abuse Brother   . ADD / ADHD Daughter   . Bipolar disorder Daughter   . Depression Daughter   . Breast cancer Sister 34  . Breast cancer Sister 46  . Alcohol abuse Brother   . Drug abuse Brother   . Cancer Paternal Aunt     breast   . Breast cancer Paternal Aunt 38  . Hyperlipidemia Maternal Grandmother   . Hypertension Maternal Grandmother   . Breast cancer Cousin 60    female  . Breast cancer Paternal Aunt 22  . Breast cancer Paternal Aunt 29  . Breast cancer Paternal Aunt 34   Social History:  Social History   Social History  . Marital status: Married    Spouse name: N/A  . Number of children: 2  . Years of education: N/A   Occupational History  . ER Tech     [employer]   Social History Main Topics  . Smoking status: Former Smoker    Packs/day: 1.00    Years: 35.00    Types: Cigarettes    Start date: 01/31/1966    Quit date: 02/01/1995  . Smokeless tobacco: Former Systems developer  Types: Sarina Ser    Quit date: 04/29/1989  . Alcohol use No  . Drug use: No  . Sexual activity: Not Currently   Other Topics Concern  . None   Social History Narrative   Hobbies: grandchildren, guitar   Caffeine: 1 cup daily   Exercise: Not at present   Diet: needs improvement   Additional History:    Assessment:   Musculoskeletal: Strength & Muscle Tone: within normal limits Gait & Station: normal Patient leans: N/A  Psychiatric Specialty Exam: Medication Refill  Associated symptoms include coughing.  Anxiety  Symptoms include insomnia. Patient reports no nervous/anxious behavior or suicidal ideas.      Review of Systems  Respiratory: Positive for cough.   Psychiatric/Behavioral: Negative for hallucinations, memory loss, substance abuse and suicidal ideas. Depression:  it comes in 3 day periods each month. The patient has insomnia. The patient is not  nervous/anxious.   All other systems reviewed and are negative.   Blood pressure (!) 142/88, pulse 92, temperature 97.7 F (36.5 C), temperature source Oral, weight 178 lb 9.6 oz (81 kg).Body mass index is 27.97 kg/m.  General Appearance: Well Groomed  Eye Contact:  Good  Speech:  Normal Rate  Volume:  Normal  Mood:  Okay  Affect:  Congruent  Thought Process:  Linear  Orientation:  Full (Time, Place, and Person)  Thought Content:  Negative  Suicidal Thoughts:  No  Homicidal Thoughts:  No  Memory:  Immediate;   Good Recent;   Good Remote;   Good  Judgement:  Good  Insight:  Good  Psychomotor Activity:  Normal  Concentration:  Good  Recall:  Good  Fund of Knowledge: Good  Language: Good  Akathisia:  Negative  Handed:  Right  AIMS (if indicated):  Done 02/01/15 normal  Assets:  Communication Skills Desire for Improvement Vocational/Educational  ADL's:  Intact  Cognition: WNL  Sleep:  poor   Is the patient at risk to self?  No. Has the patient been a risk to self in the past 6 months?  No. Has the patient been a risk to self within the distant past?  Yes.   Is the patient a risk to others?  No. Has the patient been a risk to others in the past 6 months?  No. Has the patient been a risk to others within the distant past?  No.  Current Medications: Current Outpatient Prescriptions  Medication Sig Dispense Refill  . acetaminophen (TYLENOL) 325 MG tablet Take 325 mg by mouth every 6 (six) hours as needed.    . ALPRAZolam (XANAX) 0.25 MG tablet Take 1 tablet (0.25 mg total) by mouth 2 (two) times daily as needed for anxiety. 60 tablet 1  . aspirin 81 MG tablet Take 81 mg by mouth daily.    . cetirizine (ZYRTEC) 10 MG tablet Take 10 mg by mouth daily.    . clobetasol cream (TEMOVATE) 0.05 % APPLY TOPICALLY 2 TIMES DAILY. 30 g 1  . NAPROXEN PO Take 1 tablet by mouth as needed.    Marland Kitchen omeprazole (PRILOSEC) 40 MG capsule TAKE 1 CAPSULE BY MOUTH DAILY. 90 capsule 3  .  ondansetron (ZOFRAN) 4 MG tablet Take 1 tablet (4 mg total) by mouth every 8 (eight) hours as needed for nausea or vomiting. 20 tablet 0  . triamcinolone cream (KENALOG) 0.1 % APPLY TOPICALLY 2 TIMES DAILY. 30 g 1  . venlafaxine XR (EFFEXOR XR) 75 MG 24 hr capsule Take 1 capsule (75 mg total) by mouth  daily at 3 pm. 90 capsule 1  . venlafaxine XR (EFFEXOR-XR) 150 MG 24 hr capsule Take 1 capsule (150 mg total) by mouth daily with breakfast. 90 capsule 1   No current facility-administered medications for this visit.     Medical Decision Making:  Established Problem, Worsening (2), Review of Medication Regimen & Side Effects (2) and Review of New Medication or Change in Dosage (2)  Treatment Plan Summary:Medication management and Plan Plan She will continue on Effexor XR 150  mg in the morning and 75 mg in the evening. She will continue on Xanax 0.25 mg twice daily as needed for anxiety. SHe has supply of the medication and does not take it on a regular basis.   She will follow-up in 3 months or earlier depending on her symptoms    More than 50% of the time spent in psychoeducation, counseling and coordination of care.    This note was generated in part or whole with voice recognition software. Voice regonition is usually quite accurate but there are transcription errors that can and very often do occur. I apologize for any typographical errors that were not detected and corrected.   Rainey Pines, MD  06/25/2016, 8:56 AM

## 2016-07-12 ENCOUNTER — Other Ambulatory Visit: Payer: Self-pay | Admitting: Internal Medicine

## 2016-09-24 ENCOUNTER — Encounter: Payer: Self-pay | Admitting: Psychiatry

## 2016-09-24 ENCOUNTER — Ambulatory Visit (INDEPENDENT_AMBULATORY_CARE_PROVIDER_SITE_OTHER): Payer: BC Managed Care – PPO | Admitting: Psychiatry

## 2016-09-24 VITALS — BP 148/86 | HR 101 | Temp 98.3°F | Wt 183.2 lb

## 2016-09-24 DIAGNOSIS — F331 Major depressive disorder, recurrent, moderate: Secondary | ICD-10-CM

## 2016-09-24 DIAGNOSIS — F411 Generalized anxiety disorder: Secondary | ICD-10-CM

## 2016-09-24 MED ORDER — ARIPIPRAZOLE 2 MG PO TABS: 2.0000 mg | ORAL_TABLET | Freq: Every day | ORAL | 1 refills | Status: DC

## 2016-09-24 MED ORDER — VENLAFAXINE HCL ER 75 MG PO CP24: 75.0000 mg | ORAL_CAPSULE | Freq: Every day | ORAL | 1 refills | Status: DC

## 2016-09-24 MED ORDER — VENLAFAXINE HCL ER 150 MG PO CP24: 150.0000 mg | ORAL_CAPSULE | Freq: Every day | ORAL | 1 refills | Status: DC

## 2016-09-24 NOTE — Progress Notes (Signed)
BH MD/PA/NP OP Progress Note  09/24/2016 9:35 AM Mikayla Cuevas  MRN:  [id number]  Subjective: Patient is a 61 year old female with history of depression who presented for follow-up appointment. She appeared tan as she was outside over the weekend with her grandson who was playing soccer. She reported that she was sitting under that she agreed but even then she became suntanned. She stated that she has been feeling depressed due to the anniversary coming up over this weekend. She has been crying more and has been gaining weight. We discussed about her medications. She reported that she has been working almost 48 hours per week. She is willing to start taking the Abilify at this time. She has been compliant with her medications. She denied having any suicidal homicidal ideations or plans. She denied having any perceptual disturbances. She reported that her daughter has a bad reaction to the lithium and she is not interested in taking lithium at this time.    She is receptive to her medications and has been compliant.  Chief Complaint:  Chief Complaint    Follow-up; Medication Refill     Visit Diagnosis:     ICD-9-CM ICD-10-CM   1. MDD (major depressive disorder), recurrent episode, moderate (HCC) 296.32 F33.1   2. Anxiety state 300.00 F41.1     Past Medical History:  Past Medical History:  Diagnosis Date  . Allergy   . Anxiety   . B12 deficiency   . Depression   . Diverticulitis   . GERD (gastroesophageal reflux disease)   . History of chicken pox   . History of colon polyps    Colonoscopy 2012 - repeat in 10 years - Dr. Vira Agar  . Hyperlipidemia   . Migraine   . Psoriasis    Clobetasol     Past Surgical History:  Procedure Laterality Date  . ABDOMINAL HYSTERECTOMY    . BREAST CYST ASPIRATION Bilateral    multiple  times   . BREAST SURGERY    . CHOLECYSTECTOMY    . COLONOSCOPY WITH PROPOFOL N/A 04/02/2016   Procedure: COLONOSCOPY WITH PROPOFOL;  Surgeon: Manya Silvas,  MD;  Location: [facility] ENDOSCOPY;  Service: Endoscopy;  Laterality: N/A;  . TONSILLECTOMY  1975   Family History:  Family History  Problem Relation Age of Onset  . Hyperlipidemia Mother   . Hypertension Mother   . Heart disease Mother   . Alcohol abuse Father   . Cancer Father        breast  . Hyperlipidemia Father   . Depression Father   . Breast cancer Father 37  . Cancer Sister        2 sisters with breast cancer  . Alcohol abuse Brother   . Drug abuse Brother   . ADD / ADHD Daughter   . Bipolar disorder Daughter   . Depression Daughter   . Breast cancer Sister 73  . Breast cancer Sister 37  . Alcohol abuse Brother   . Drug abuse Brother   . Cancer Paternal Aunt        breast   . Breast cancer Paternal Aunt 57  . Hyperlipidemia Maternal Grandmother   . Hypertension Maternal Grandmother   . Breast cancer Cousin 31       female  . Breast cancer Paternal Aunt 9  . Breast cancer Paternal Aunt 51  . Breast cancer Paternal Aunt 69   Social History:  Social History   Social History  . Marital status: Married  Spouse name: N/A  . Number of children: 2  . Years of education: N/A   Occupational History  . ER Tech     [employer]   Social History Main Topics  . Smoking status: Former Smoker    Packs/day: 1.00    Years: 35.00    Types: Cigarettes    Start date: 01/31/1966    Quit date: 02/01/1995  . Smokeless tobacco: Former Systems developer    Types: Chew    Quit date: 04/29/1989  . Alcohol use No  . Drug use: No  . Sexual activity: Not Currently   Other Topics Concern  . None   Social History Narrative   Hobbies: grandchildren, guitar   Caffeine: 1 cup daily   Exercise: Not at present   Diet: needs improvement   Additional History:    Assessment:   Musculoskeletal: Strength & Muscle Tone: within normal limits Gait & Station: normal Patient leans: N/A  Psychiatric Specialty Exam: Medication Refill  Associated symptoms include coughing.  Anxiety  Symptoms  include insomnia. Patient reports no nervous/anxious behavior or suicidal ideas.      Review of Systems  Respiratory: Positive for cough.   Psychiatric/Behavioral: Negative for hallucinations, memory loss, substance abuse and suicidal ideas. Depression:  it comes in 3 day periods each month. The patient has insomnia. The patient is not nervous/anxious.   All other systems reviewed and are negative.   Blood pressure (!) 148/86, pulse (!) 101, temperature 98.3 F (36.8 C), temperature source Oral, weight 183 lb 3.2 oz (83.1 kg).Body mass index is 28.69 kg/m.  General Appearance: Well Groomed  Eye Contact:  Good  Speech:  Normal Rate  Volume:  Normal  Mood:  Okay  Affect:  Congruent  Thought Process:  Linear  Orientation:  Full (Time, Place, and Person)  Thought Content:  Negative  Suicidal Thoughts:  No  Homicidal Thoughts:  No  Memory:  Immediate;   Good Recent;   Good Remote;   Good  Judgement:  Good  Insight:  Good  Psychomotor Activity:  Normal  Concentration:  Good  Recall:  Good  Fund of Knowledge: Good  Language: Good  Akathisia:  Negative  Handed:  Right  AIMS (if indicated):  Done 02/01/15 normal  Assets:  Communication Skills Desire for Improvement Vocational/Educational  ADL's:  Intact  Cognition: WNL  Sleep:  poor   Is the patient at risk to self?  No. Has the patient been a risk to self in the past 6 months?  No. Has the patient been a risk to self within the distant past?  Yes.   Is the patient a risk to others?  No. Has the patient been a risk to others in the past 6 months?  No. Has the patient been a risk to others within the distant past?  No.  Current Medications: Current Outpatient Prescriptions  Medication Sig Dispense Refill  . acetaminophen (TYLENOL) 325 MG tablet Take 325 mg by mouth every 6 (six) hours as needed.    . ALPRAZolam (XANAX) 0.25 MG tablet Take 1 tablet (0.25 mg total) by mouth 2 (two) times daily as needed for anxiety. 60  tablet 1  . aspirin 81 MG tablet Take 81 mg by mouth daily.    . cetirizine (ZYRTEC) 10 MG tablet Take 10 mg by mouth daily.    . clobetasol cream (TEMOVATE) 0.05 % APPLY TOPICALLY 2 TIMES DAILY. 30 g 1  . NAPROXEN PO Take 1 tablet by mouth as needed.    Marland Kitchen  omeprazole (PRILOSEC) 40 MG capsule TAKE 1 CAPSULE BY MOUTH DAILY 90 capsule 1  . ondansetron (ZOFRAN) 4 MG tablet Take 1 tablet (4 mg total) by mouth every 8 (eight) hours as needed for nausea or vomiting. 20 tablet 0  . triamcinolone cream (KENALOG) 0.1 % APPLY TOPICALLY 2 TIMES DAILY. 30 g 1  . venlafaxine XR (EFFEXOR XR) 75 MG 24 hr capsule Take 1 capsule (75 mg total) by mouth daily at 3 pm. 90 capsule 1  . venlafaxine XR (EFFEXOR-XR) 150 MG 24 hr capsule Take 1 capsule (150 mg total) by mouth daily with breakfast. 90 capsule 1  . ARIPiprazole (ABILIFY) 2 MG tablet Take 1 tablet (2 mg total) by mouth daily. 30 tablet 1   No current facility-administered medications for this visit.     Medical Decision Making:  Established Problem, Worsening (2), Review of Medication Regimen & Side Effects (2) and Review of New Medication or Change in Dosage (2)  Treatment Plan Summary:Medication management and Plan Plan She will continue on Effexor XR 150  mg in the morning and 75 mg in the evening. She will continue on Xanax 0.25 mg twice daily as needed for anxiety. SHe has supply of the medication and does not take it on a regular basis.   I will start her on Abilify 2 mg daily and she agreed with the plan. Discussed with her about side effects in detail.  She will follow-up in 2 months or earlier depending on her symptoms    More than 50% of the time spent in psychoeducation, counseling and coordination of care.    This note was generated in part or whole with voice recognition software. Voice regonition is usually quite accurate but there are transcription errors that can and very often do occur. I apologize for any typographical errors that  were not detected and corrected.   Rainey Pines, MD  09/24/2016, 9:35 AM

## 2016-11-26 ENCOUNTER — Encounter: Payer: Self-pay | Admitting: Psychiatry

## 2016-11-26 ENCOUNTER — Ambulatory Visit (INDEPENDENT_AMBULATORY_CARE_PROVIDER_SITE_OTHER): Payer: BC Managed Care – PPO | Admitting: Psychiatry

## 2016-11-26 VITALS — BP 166/91 | HR 89 | Temp 98.1°F | Wt 185.2 lb

## 2016-11-26 DIAGNOSIS — F331 Major depressive disorder, recurrent, moderate: Secondary | ICD-10-CM | POA: Diagnosis not present

## 2016-11-26 DIAGNOSIS — F411 Generalized anxiety disorder: Secondary | ICD-10-CM | POA: Diagnosis not present

## 2016-11-26 MED ORDER — VENLAFAXINE HCL ER 150 MG PO CP24: 150.0000 mg | ORAL_CAPSULE | Freq: Every day | ORAL | 1 refills | Status: DC

## 2016-11-26 MED ORDER — VENLAFAXINE HCL ER 75 MG PO CP24
75.0000 mg | ORAL_CAPSULE | Freq: Every day | ORAL | 1 refills | Status: DC
Start: 2016-11-26 — End: 2017-03-11

## 2016-11-26 MED ORDER — ALPRAZOLAM 0.25 MG PO TABS: 0.2500 mg | ORAL_TABLET | Freq: Two times a day (BID) | ORAL | 1 refills | Status: DC | PRN

## 2016-11-26 NOTE — Progress Notes (Signed)
[facility] MD/PA/NP OP Progress Note  11/26/2016 9:25 AM Mikayla Cuevas  MRN:  [id number]  Subjective: Patient is a 61 year old female with history of depression who presented for follow-up appointment. She reported that she is coming from work. Patient reported that she has not tried the Abilify she is scared of the side effects of the medication. She stated that she continues to feel depressed and has lack of energy. She reported that she has been taking Xanax on a when necessary basis. She has been compliant with her Effexor. She reported that she becomes more depressed during the winter and discussed about restarting the Abilify and the side effects in detail. She reported that she will start with half a pill at this time. She has picked up the prescription but has not started taking the medication.   Patient reported that she is planning to retire in 2022 and has been working towards that goal. She appeared motivated and has been working long hours. She denied having any suicidal homicidal ideations or plans.     She is receptive to her medications and has been compliant.  Chief Complaint:  Chief Complaint    Follow-up; Medication Refill     Visit Diagnosis:     ICD-10-CM   1. MDD (major depressive disorder), recurrent episode, moderate (HCC) F33.1   2. Anxiety state F41.1     Past Medical History:  Past Medical History:  Diagnosis Date  . Allergy   . Anxiety   . B12 deficiency   . Depression   . Diverticulitis   . GERD (gastroesophageal reflux disease)   . History of chicken pox   . History of colon polyps    Colonoscopy 2012 - repeat in 10 years - Dr. Vira Agar  . Hyperlipidemia   . Migraine   . Psoriasis    Clobetasol     Past Surgical History:  Procedure Laterality Date  . ABDOMINAL HYSTERECTOMY    . BREAST CYST ASPIRATION Bilateral    multiple  times   . BREAST SURGERY    . CHOLECYSTECTOMY    . COLONOSCOPY WITH PROPOFOL N/A 04/02/2016   Procedure: COLONOSCOPY WITH  PROPOFOL;  Surgeon: Manya Silvas, MD;  Location: [facility] ENDOSCOPY;  Service: Endoscopy;  Laterality: N/A;  . TONSILLECTOMY  1975   Family History:  Family History  Problem Relation Age of Onset  . Hyperlipidemia Mother   . Hypertension Mother   . Heart disease Mother   . Alcohol abuse Father   . Cancer Father        breast  . Hyperlipidemia Father   . Depression Father   . Breast cancer Father 23  . Cancer Sister        2 sisters with breast cancer  . Alcohol abuse Brother   . Drug abuse Brother   . ADD / ADHD Daughter   . Bipolar disorder Daughter   . Depression Daughter   . Breast cancer Sister 59  . Breast cancer Sister 91  . Alcohol abuse Brother   . Drug abuse Brother   . Cancer Paternal Aunt        breast   . Breast cancer Paternal Aunt 57  . Hyperlipidemia Maternal Grandmother   . Hypertension Maternal Grandmother   . Breast cancer Cousin 54       female  . Breast cancer Paternal Aunt 52  . Breast cancer Paternal Aunt 18  . Breast cancer Paternal Aunt 4   Social History:  Social History  Social History  . Marital status: Married    Spouse name: N/A  . Number of children: 2  . Years of education: N/A   Occupational History  . ER Tech     [employer]   Social History Main Topics  . Smoking status: Former Smoker    Packs/day: 1.00    Years: 35.00    Types: Cigarettes    Start date: 01/31/1966    Quit date: 02/01/1995  . Smokeless tobacco: Former Systems developer    Types: Chew    Quit date: 04/29/1989  . Alcohol use No  . Drug use: No  . Sexual activity: Not Currently   Other Topics Concern  . None   Social History Narrative   Hobbies: grandchildren, guitar   Caffeine: 1 cup daily   Exercise: Not at present   Diet: needs improvement   Additional History:    Assessment:   Musculoskeletal: Strength & Muscle Tone: within normal limits Gait & Station: normal Patient leans: N/A  Psychiatric Specialty Exam: Medication Refill  Associated symptoms include  coughing.  Anxiety  Symptoms include insomnia. Patient reports no nervous/anxious behavior or suicidal ideas.      Review of Systems  Respiratory: Positive for cough.   Psychiatric/Behavioral: Negative for hallucinations, memory loss, substance abuse and suicidal ideas. Depression:  it comes in 3 day periods each month. The patient has insomnia. The patient is not nervous/anxious.   All other systems reviewed and are negative.   Blood pressure (!) 166/91, pulse 89, temperature 98.1 F (36.7 C), temperature source Oral, weight 185 lb 3.2 oz (84 kg).Body mass index is 29.01 kg/m.  General Appearance: Well Groomed  Eye Contact:  Good  Speech:  Normal Rate  Volume:  Normal  Mood:  Okay  Affect:  Congruent  Thought Process:  Linear  Orientation:  Full (Time, Place, and Person)  Thought Content:  Negative  Suicidal Thoughts:  No  Homicidal Thoughts:  No  Memory:  Immediate;   Good Recent;   Good Remote;   Good  Judgement:  Good  Insight:  Good  Psychomotor Activity:  Normal  Concentration:  Good  Recall:  Good  Fund of Knowledge: Good  Language: Good  Akathisia:  Negative  Handed:  Right  AIMS (if indicated):  Done 02/01/15 normal  Assets:  Communication Skills Desire for Improvement Vocational/Educational  ADL's:  Intact  Cognition: WNL  Sleep:  poor   Is the patient at risk to self?  No. Has the patient been a risk to self in the past 6 months?  No. Has the patient been a risk to self within the distant past?  Yes.   Is the patient a risk to others?  No. Has the patient been a risk to others in the past 6 months?  No. Has the patient been a risk to others within the distant past?  No.  Current Medications: Current Outpatient Prescriptions  Medication Sig Dispense Refill  . acetaminophen (TYLENOL) 325 MG tablet Take 325 mg by mouth every 6 (six) hours as needed.    . ALPRAZolam (XANAX) 0.25 MG tablet Take 1 tablet (0.25 mg total) by mouth 2 (two) times daily as  needed for anxiety. 60 tablet 1  . cetirizine (ZYRTEC) 10 MG tablet Take 10 mg by mouth daily.    . clobetasol cream (TEMOVATE) 0.05 % APPLY TOPICALLY 2 TIMES DAILY. 30 g 1  . NAPROXEN PO Take 1 tablet by mouth as needed.    Marland Kitchen omeprazole (PRILOSEC) 40 MG  capsule TAKE 1 CAPSULE BY MOUTH DAILY 90 capsule 1  . ondansetron (ZOFRAN) 4 MG tablet Take 1 tablet (4 mg total) by mouth every 8 (eight) hours as needed for nausea or vomiting. 20 tablet 0  . triamcinolone cream (KENALOG) 0.1 % APPLY TOPICALLY 2 TIMES DAILY. 30 g 1  . venlafaxine XR (EFFEXOR XR) 75 MG 24 hr capsule Take 1 capsule (75 mg total) by mouth daily at 3 pm. 90 capsule 1  . venlafaxine XR (EFFEXOR-XR) 150 MG 24 hr capsule Take 1 capsule (150 mg total) by mouth daily with breakfast. 90 capsule 1   No current facility-administered medications for this visit.     Medical Decision Making:  Established Problem, Worsening (2), Review of Medication Regimen & Side Effects (2) and Review of New Medication or Change in Dosage (2)  Treatment Plan Summary:Medication management and Plan Plan She will continue on Effexor XR 150  mg in the morning and 75 mg in the evening. She will continue on Xanax 0.25 mg twice daily as needed for anxiety. Patient has supply of Abilify which was given to her at the last appointment. No medication refill at this time.   She will follow-up in 2 months or earlier depending on her symptoms    More than 50% of the time spent in psychoeducation, counseling and coordination of care.    This note was generated in part or whole with voice recognition software. Voice regonition is usually quite accurate but there are transcription errors that can and very often do occur. I apologize for any typographical errors that were not detected and corrected.   Rainey Pines, MD  11/26/2016, 9:25 AM

## 2017-01-01 ENCOUNTER — Other Ambulatory Visit: Payer: Self-pay | Admitting: Internal Medicine

## 2017-01-01 DIAGNOSIS — Z1231 Encounter for screening mammogram for malignant neoplasm of breast: Secondary | ICD-10-CM

## 2017-01-22 ENCOUNTER — Ambulatory Visit
Admission: RE | Admit: 2017-01-22 | Discharge: 2017-01-22 | Disposition: A | Discharge status: Home / Self Care | Payer: BC Managed Care – PPO | Source: Ambulatory Visit | Attending: Internal Medicine | Admitting: Internal Medicine

## 2017-01-22 DIAGNOSIS — Z1231 Encounter for screening mammogram for malignant neoplasm of breast: Secondary | ICD-10-CM | POA: Insufficient documentation

## 2017-01-25 ENCOUNTER — Other Ambulatory Visit: Payer: Self-pay | Admitting: Internal Medicine

## 2017-01-28 ENCOUNTER — Encounter: Payer: Self-pay | Admitting: Psychiatry

## 2017-01-28 ENCOUNTER — Ambulatory Visit (INDEPENDENT_AMBULATORY_CARE_PROVIDER_SITE_OTHER): Payer: BC Managed Care – PPO | Admitting: Psychiatry

## 2017-01-28 VITALS — BP 151/94 | HR 80 | Temp 97.7°F | Wt 187.6 lb

## 2017-01-28 DIAGNOSIS — F411 Generalized anxiety disorder: Secondary | ICD-10-CM

## 2017-01-28 DIAGNOSIS — F331 Major depressive disorder, recurrent, moderate: Secondary | ICD-10-CM | POA: Diagnosis not present

## 2017-01-28 MED ORDER — LAMOTRIGINE 25 MG PO TABS: ORAL_TABLET | ORAL | 0 refills | Status: DC

## 2017-01-28 NOTE — Progress Notes (Signed)
BH MD/PA/NP OP Progress Note  01/28/2017 9:11 AM KLEE KOLEK  MRN:  [id number]  Subjective: Patient is a 61 year old female with history of depression who presented for follow-up appointment. She reported that she is coming from work she was working for 4 days in a row and was working last night. Patient reported that she tried Abilify and it was causing her insomnia. She reported that she did not like the medication. She reported that she does not want to continue taking Abilify at this time. She reported that she has mood swings and especially has concerned about  depression with the upcoming holidays and the change in the season. We discussed about different medication options. Patient is willing to try lamotrigine at this time. She currently denied having any suicidal ideations or plans. She reported that she has lack of energy when she has days off which is causing her to spend time in the bed. However she tries to attend to her grandchildren and has been having good time with them. She stated that venlafaxine is helping her. She takes Xanax on a when necessary basis. She denied having any side effects of the medications at this time.   She is receptive to her medications and has been compliant.  Chief Complaint:  Chief Complaint    Follow-up; Medication Refill     Visit Diagnosis:     ICD-10-CM   1. MDD (major depressive disorder), recurrent episode, moderate (HCC) F33.1   2. Anxiety state F41.1     Past Medical History:  Past Medical History:  Diagnosis Date  . Allergy   . Anxiety   . B12 deficiency   . Depression   . Diverticulitis   . GERD (gastroesophageal reflux disease)   . History of chicken pox   . History of colon polyps    Colonoscopy 2012 - repeat in 10 years - Dr. Vira Agar  . Hyperlipidemia   . Migraine   . Psoriasis    Clobetasol     Past Surgical History:  Procedure Laterality Date  . ABDOMINAL HYSTERECTOMY    . BREAST CYST ASPIRATION Bilateral     multiple  times   . BREAST SURGERY    . CHOLECYSTECTOMY    . COLONOSCOPY WITH PROPOFOL N/A 04/02/2016   Procedure: COLONOSCOPY WITH PROPOFOL;  Surgeon: Manya Silvas, MD;  Location: [facility] ENDOSCOPY;  Service: Endoscopy;  Laterality: N/A;  . TONSILLECTOMY  1975   Family History:  Family History  Problem Relation Age of Onset  . Hyperlipidemia Mother   . Hypertension Mother   . Heart disease Mother   . Alcohol abuse Father   . Cancer Father        breast  . Hyperlipidemia Father   . Depression Father   . Breast cancer Father 34       63 second occurance  . Cancer Sister        2 sisters with breast cancer  . Alcohol abuse Brother   . Drug abuse Brother   . ADD / ADHD Daughter   . Bipolar disorder Daughter   . Depression Daughter   . Breast cancer Sister 105  . Breast cancer Sister 57  . Alcohol abuse Brother   . Drug abuse Brother   . Cancer Paternal Aunt        breast   . Breast cancer Paternal Aunt 36  . Hyperlipidemia Maternal Grandmother   . Hypertension Maternal Grandmother   . Breast cancer Cousin 44  female  . Breast cancer Paternal Aunt 37  . Breast cancer Paternal Aunt 41  . Breast cancer Paternal Aunt 65   Social History:  Social History   Social History  . Marital status: Married    Spouse name: N/A  . Number of children: 2  . Years of education: N/A   Occupational History  . ER Tech     [employer]   Social History Main Topics  . Smoking status: Former Smoker    Packs/day: 1.00    Years: 35.00    Types: Cigarettes    Start date: 01/31/1966    Quit date: 02/01/1995  . Smokeless tobacco: Former Systems developer    Types: Chew    Quit date: 04/29/1989  . Alcohol use No  . Drug use: No  . Sexual activity: Not Currently   Other Topics Concern  . None   Social History Narrative   Hobbies: grandchildren, guitar   Caffeine: 1 cup daily   Exercise: Not at present   Diet: needs improvement   Additional History:    Assessment:    Musculoskeletal: Strength & Muscle Tone: within normal limits Gait & Station: normal Patient leans: N/A  Psychiatric Specialty Exam: Medication Refill  Associated symptoms include coughing.  Anxiety  Symptoms include insomnia. Patient reports no nervous/anxious behavior or suicidal ideas.      Review of Systems  Respiratory: Positive for cough.   Psychiatric/Behavioral: Negative for hallucinations, memory loss, substance abuse and suicidal ideas. Depression:  it comes in 3 day periods each month. The patient has insomnia. The patient is not nervous/anxious.   All other systems reviewed and are negative.   Blood pressure (!) 151/94, pulse 80, temperature 97.7 F (36.5 C), temperature source Oral, weight 187 lb 9.6 oz (85.1 kg).Body mass index is 29.38 kg/m.  General Appearance: Well Groomed  Eye Contact:  Good  Speech:  Normal Rate  Volume:  Normal  Mood:  Okay  Affect:  Congruent  Thought Process:  Linear  Orientation:  Full (Time, Place, and Person)  Thought Content:  Negative  Suicidal Thoughts:  No  Homicidal Thoughts:  No  Memory:  Immediate;   Good Recent;   Good Remote;   Good  Judgement:  Good  Insight:  Good  Psychomotor Activity:  Normal  Concentration:  Good  Recall:  Good  Fund of Knowledge: Good  Language: Good  Akathisia:  Negative  Handed:  Right  AIMS (if indicated):  Done 02/01/15 normal  Assets:  Communication Skills Desire for Improvement Vocational/Educational  ADL's:  Intact  Cognition: WNL  Sleep:  poor   Is the patient at risk to self?  No. Has the patient been a risk to self in the past 6 months?  No. Has the patient been a risk to self within the distant past?  Yes.   Is the patient a risk to others?  No. Has the patient been a risk to others in the past 6 months?  No. Has the patient been a risk to others within the distant past?  No.  Current Medications: Current Outpatient Prescriptions  Medication Sig Dispense Refill  .  acetaminophen (TYLENOL) 325 MG tablet Take 325 mg by mouth every 6 (six) hours as needed.    . ALPRAZolam (XANAX) 0.25 MG tablet Take 1 tablet (0.25 mg total) by mouth 2 (two) times daily as needed for anxiety. 60 tablet 1  . cetirizine (ZYRTEC) 10 MG tablet Take 10 mg by mouth daily.    . clobetasol  cream (TEMOVATE) 0.05 % APPLY TOPICALLY 2 TIMES DAILY. 30 g 1  . NAPROXEN PO Take 1 tablet by mouth as needed.    Marland Kitchen omeprazole (PRILOSEC) 40 MG capsule TAKE 1 CAPSULE BY MOUTH DAILY 90 capsule 1  . ondansetron (ZOFRAN) 4 MG tablet Take 1 tablet (4 mg total) by mouth every 8 (eight) hours as needed for nausea or vomiting. 20 tablet 0  . triamcinolone cream (KENALOG) 0.1 % APPLY TOPICALLY 2 TIMES DAILY. 30 g 1  . venlafaxine XR (EFFEXOR XR) 75 MG 24 hr capsule Take 1 capsule (75 mg total) by mouth daily at 3 pm. 90 capsule 1  . venlafaxine XR (EFFEXOR-XR) 150 MG 24 hr capsule Take 1 capsule (150 mg total) by mouth daily with breakfast. 90 capsule 1  . lamoTRIgine (LAMICTAL) 25 MG tablet Take 1 pill daily x 2 weeks then 2 pills daily 60 tablet 0   No current facility-administered medications for this visit.     Medical Decision Making:  Established Problem, Worsening (2), Review of Medication Regimen & Side Effects (2) and Review of New Medication or Change in Dosage (2)  Treatment Plan Summary:Medication management and Plan Plan She will continue on Effexor XR 150  mg in the morning and 75 mg in the evening.She has supply of the medication. She will continue on Xanax 0.25 mg twice daily as needed for anxiety.Patient has supply of the medication. D/C  Abilify Start her on lamotrigine 25 mg daily for 2 weeks and then titrate to 50 mg daily. Discussed with her about the side effect of the medication including risk of rash and Katherina Right syndrome and she demonstrated understanding.   She will follow-up in 1  months or earlier depending on her symptoms    More than 50% of the time spent in  psychoeducation, counseling and coordination of care.    This note was generated in part or whole with voice recognition software. Voice regonition is usually quite accurate but there are transcription errors that can and very often do occur. I apologize for any typographical errors that were not detected and corrected.   Rainey Pines, MD  01/28/2017, 9:11 AM

## 2017-03-11 ENCOUNTER — Encounter: Payer: Self-pay | Admitting: Psychiatry

## 2017-03-11 ENCOUNTER — Other Ambulatory Visit: Payer: Self-pay

## 2017-03-11 ENCOUNTER — Ambulatory Visit: Payer: BC Managed Care – PPO | Admitting: Psychiatry

## 2017-03-11 VITALS — BP 160/95 | HR 84 | Temp 97.9°F | Wt 183.8 lb

## 2017-03-11 DIAGNOSIS — F3481 Disruptive mood dysregulation disorder: Secondary | ICD-10-CM | POA: Diagnosis not present

## 2017-03-11 MED ORDER — LITHIUM CARBONATE 150 MG PO CAPS: 150.0000 mg | ORAL_CAPSULE | Freq: Two times a day (BID) | ORAL | 1 refills | Status: DC

## 2017-03-11 MED ORDER — VENLAFAXINE HCL ER 150 MG PO CP24: 150.0000 mg | ORAL_CAPSULE | Freq: Every day | ORAL | 1 refills | Status: DC

## 2017-03-11 MED ORDER — VENLAFAXINE HCL ER 75 MG PO CP24: 75.0000 mg | ORAL_CAPSULE | Freq: Every day | ORAL | 1 refills | Status: DC

## 2017-03-11 MED ORDER — ALPRAZOLAM 0.25 MG PO TABS: 0.2500 mg | ORAL_TABLET | Freq: Two times a day (BID) | ORAL | 1 refills | Status: DC | PRN

## 2017-03-11 NOTE — Progress Notes (Signed)
[facility] MD/PA/NP OP Progress Note  03/11/2017 10:02 AM Mikayla Cuevas  MRN:  [id number]  Subjective: Patient is a 61 year old female with history of depression who presented for follow-up appointment. She reported that she started having rash from the lamotrigine and she stopped the medications after 2 weeks. Patient reported that she has been taking Benadryl. She reported that she continues to have mood swings and gets agitated quickly. We discussed about her symptoms in detail. She reported that she wants to try some other medication. She reported that she has been tried on lamotrigine and Abilify in the past. We discussed about her medications in detail. She reported that she is willing to try lithium at this time. She is going to work on the holidays and is keeping herself busy. She has been sleeping well at night. She denied having any perceptual disturbances. She denied having any suicidal homicidal ideations or plans.     She takes Xanax on a when necessary basis. She denied having any side effects of the medications at this time.   She is receptive to her medications and has been compliant.  Chief Complaint:  Chief Complaint    Follow-up; Medication Refill; Medication Problem     Visit Diagnosis:     ICD-10-CM   1. DMDD (disruptive mood dysregulation disorder) (HCC) F34.81     Past Medical History:  Past Medical History:  Diagnosis Date  . Allergy   . Anxiety   . B12 deficiency   . Depression   . Diverticulitis   . GERD (gastroesophageal reflux disease)   . History of chicken pox   . History of colon polyps    Colonoscopy 2012 - repeat in 10 years - Dr. Vira Agar  . Hyperlipidemia   . Migraine   . Psoriasis    Clobetasol     Past Surgical History:  Procedure Laterality Date  . ABDOMINAL HYSTERECTOMY    . BREAST CYST ASPIRATION Bilateral    multiple  times   . BREAST SURGERY    . CHOLECYSTECTOMY    . COLONOSCOPY WITH PROPOFOL N/A 04/02/2016   Performed by Manya Silvas, MD at [facility]  . TONSILLECTOMY  1975   Family History:  Family History  Problem Relation Age of Onset  . Hyperlipidemia Mother   . Hypertension Mother   . Heart disease Mother   . Alcohol abuse Father   . Cancer Father        breast  . Hyperlipidemia Father   . Depression Father   . Breast cancer Father 9       63 second occurance  . Cancer Sister        2 sisters with breast cancer  . Alcohol abuse Brother   . Drug abuse Brother   . ADD / ADHD Daughter   . Bipolar disorder Daughter   . Depression Daughter   . Breast cancer Sister 79  . Breast cancer Sister 68  . Alcohol abuse Brother   . Drug abuse Brother   . Cancer Paternal Aunt        breast   . Breast cancer Paternal Aunt 75  . Hyperlipidemia Maternal Grandmother   . Hypertension Maternal Grandmother   . Breast cancer Cousin 29       female  . Breast cancer Paternal Aunt 73  . Breast cancer Paternal Aunt 50  . Breast cancer Paternal Aunt 66   Social History:  Social History   Socioeconomic History  . Marital status: Divorced  Spouse name: None  . Number of children: 2  . Years of education: None  . Highest education level: Associate degree: occupational, Hotel manager, or vocational program  Social Needs  . Financial resource strain: Not hard at all  . Food insecurity - worry: Never true  . Food insecurity - inability: Never true  . Transportation needs - medical: No  . Transportation needs - non-medical: No  Occupational History  . Occupation: ER Tech    Comment: full time  Tobacco Use  . Smoking status: Former Smoker    Packs/day: 1.00    Years: 35.00    Pack years: 35.00    Types: Cigarettes    Start date: 01/31/1966    Last attempt to quit: 02/01/1995    Years since quitting: 22.1  . Smokeless tobacco: Former Systems developer    Types: [place] date: 04/29/1989  Substance and Sexual Activity  . Alcohol use: No    Alcohol/week: 0.0 oz  . Drug use: No  . Sexual activity: Not Currently   Other Topics Concern  . None  Social History Narrative   Hobbies: grandchildren, guitar   Caffeine: 1 cup daily   Exercise: Not at present   Diet: needs improvement   Additional History:    Assessment:   Musculoskeletal: Strength & Muscle Tone: within normal limits Gait & Station: normal Patient leans: N/A  Psychiatric Specialty Exam: Medication Refill  Associated symptoms include coughing.  Anxiety  Symptoms include insomnia. Patient reports no nervous/anxious behavior or suicidal ideas.      Review of Systems  Respiratory: Positive for cough.   Psychiatric/Behavioral: Negative for hallucinations, memory loss, substance abuse and suicidal ideas. Depression:  it comes in 3 day periods each month. The patient has insomnia. The patient is not nervous/anxious.   All other systems reviewed and are negative.   Blood pressure (!) 160/95, pulse 84, temperature 97.9 F (36.6 C), temperature source Oral, weight 183 lb 12.8 oz (83.4 kg).Body mass index is 28.79 kg/m.  General Appearance: Well Groomed  Eye Contact:  Good  Speech:  Normal Rate  Volume:  Normal  Mood:  Okay  Affect:  Congruent  Thought Process:  Linear  Orientation:  Full (Time, Place, and Person)  Thought Content:  Negative  Suicidal Thoughts:  No  Homicidal Thoughts:  No  Memory:  Immediate;   Good Recent;   Good Remote;   Good  Judgement:  Good  Insight:  Good  Psychomotor Activity:  Normal  Concentration:  Good  Recall:  Good  Fund of Knowledge: Good  Language: Good  Akathisia:  Negative  Handed:  Right  AIMS (if indicated):  Done 02/01/15 normal  Assets:  Communication Skills Desire for Improvement Vocational/Educational  ADL's:  Intact  Cognition: WNL  Sleep:  poor   Is the patient at risk to self?  No. Has the patient been a risk to self in the past 6 months?  No. Has the patient been a risk to self within the distant past?  Yes.   Is the patient a risk to others?  No. Has the patient  been a risk to others in the past 6 months?  No. Has the patient been a risk to others within the distant past?  No.  Current Medications: Current Outpatient Medications  Medication Sig Dispense Refill  . acetaminophen (TYLENOL) 325 MG tablet Take 325 mg by mouth every 6 (six) hours as needed.    . ALPRAZolam (XANAX) 0.25 MG tablet Take 1  tablet (0.25 mg total) 2 (two) times daily as needed by mouth for anxiety. 60 tablet 1  . cetirizine (ZYRTEC) 10 MG tablet Take 10 mg by mouth daily.    . clobetasol cream (TEMOVATE) 0.05 % APPLY TOPICALLY 2 TIMES DAILY. 30 g 1  . NAPROXEN PO Take 1 tablet by mouth as needed.    Marland Kitchen omeprazole (PRILOSEC) 40 MG capsule TAKE 1 CAPSULE BY MOUTH DAILY 90 capsule 1  . ondansetron (ZOFRAN) 4 MG tablet Take 1 tablet (4 mg total) by mouth every 8 (eight) hours as needed for nausea or vomiting. 20 tablet 0  . triamcinolone cream (KENALOG) 0.1 % APPLY TOPICALLY 2 TIMES DAILY. 30 g 1  . venlafaxine XR (EFFEXOR XR) 75 MG 24 hr capsule Take 1 capsule (75 mg total) daily at 3 pm by mouth. 90 capsule 1  . venlafaxine XR (EFFEXOR-XR) 150 MG 24 hr capsule Take 1 capsule (150 mg total) daily with breakfast by mouth. 90 capsule 1  . lithium carbonate 150 MG capsule Take 1 capsule (150 mg total) 2 (two) times daily with a meal by mouth. 60 capsule 1   No current facility-administered medications for this visit.     Medical Decision Making:  Established Problem, Worsening (2), Review of Medication Regimen & Side Effects (2) and Review of New Medication or Change in Dosage (2)  Treatment Plan Summary:Medication management and Plan Plan   She will continue on Effexor XR 150  mg in the morning and 75 mg in the evening.  She will continue on Xanax 0.25 mg twice daily as needed for anxiety.  D/C  Lamotrigine  Start her on lithium 150 mg every at bedtime and then she will titrate the dose to 300 mg after 2 days. Discussed with her about the side effects in detail and she  demonstrated understanding.  She will follow-up in 1  months or earlier depending on her symptoms    More than 50% of the time spent in psychoeducation, counseling and coordination of care.    This note was generated in part or whole with voice recognition software. Voice regonition is usually quite accurate but there are transcription errors that can and very often do occur. I apologize for any typographical errors that were not detected and corrected.   Rainey Pines, MD  03/11/2017, 10:02 AM

## 2017-04-01 ENCOUNTER — Ambulatory Visit: Payer: BC Managed Care – PPO | Admitting: Psychiatry

## 2017-05-27 ENCOUNTER — Ambulatory Visit: Payer: BC Managed Care – PPO | Admitting: Psychiatry

## 2017-05-27 ENCOUNTER — Encounter: Payer: Self-pay | Admitting: Psychiatry

## 2017-05-27 VITALS — BP 169/93 | HR 80 | Temp 97.8°F | Wt 183.8 lb

## 2017-05-27 DIAGNOSIS — F411 Generalized anxiety disorder: Secondary | ICD-10-CM | POA: Diagnosis not present

## 2017-05-27 DIAGNOSIS — F3481 Disruptive mood dysregulation disorder: Secondary | ICD-10-CM

## 2017-05-27 MED ORDER — ALPRAZOLAM 0.25 MG PO TABS: 0.2500 mg | ORAL_TABLET | Freq: Two times a day (BID) | ORAL | 1 refills | Status: DC | PRN

## 2017-05-27 MED ORDER — VENLAFAXINE HCL ER 150 MG PO CP24: 150.0000 mg | ORAL_CAPSULE | Freq: Every day | ORAL | 1 refills | Status: DC

## 2017-05-27 MED ORDER — VENLAFAXINE HCL ER 75 MG PO CP24: 75.0000 mg | ORAL_CAPSULE | Freq: Every day | ORAL | 1 refills | Status: DC

## 2017-05-27 NOTE — Progress Notes (Signed)
[place] MD/PA/NP OP Progress Note  05/27/2017 10:57 AM Mikayla Cuevas  MRN:  [id number]  Subjective: Patient is a 62 year old female with history of depression who presented for follow-up appointment. She reported that she did not tolerate the lithium well and stopped taking the medication after 2 weeks. She reported it was making her very tired. She reported that she is taking the Effexor and the Xanax as prescribed. She reported that she continues to work in the [employer]. Patient reported that she has mood swings once per month and she is able to tolerate them. She does not want to change any medications at this time. She is not interested in doing therapy. We discussed about her symptoms in detail. She has tried several medications and was unable to tolerate them. She reported that she takes Xanax when she started having mood swings and it helps her. She appears calm and alert during the interview. She denied having any suicidal ideations or plans. She reported that she fel around the new years and did not have any acute injuries. She is compliant with her medications. She denied having any sleep issues at this time. We discussed about her medications in detail and she is not having any side effects at this time.      She takes Xanax on a when necessary basis. She denied having any side effects of the medications at this time.   She is receptive to her medications and has been compliant.  Chief Complaint:  Chief Complaint    Follow-up; Medication Problem; Medication Reaction     Visit Diagnosis:     ICD-10-CM   1. DMDD (disruptive mood dysregulation disorder) (HCC) F34.81   2. Anxiety state F41.1     Past Medical History:  Past Medical History:  Diagnosis Date  . Allergy   . Anxiety   . B12 deficiency   . Depression   . Diverticulitis   . GERD (gastroesophageal reflux disease)   . History of chicken pox   . History of colon polyps    Colonoscopy 2012 - repeat in 10 years - Dr. Vira Agar  .  Hyperlipidemia   . Migraine   . Psoriasis    Clobetasol     Past Surgical History:  Procedure Laterality Date  . ABDOMINAL HYSTERECTOMY    . BREAST CYST ASPIRATION Bilateral    multiple  times   . BREAST SURGERY    . CHOLECYSTECTOMY    . COLONOSCOPY WITH PROPOFOL N/A 04/02/2016   Procedure: COLONOSCOPY WITH PROPOFOL;  Surgeon: Manya Silvas, MD;  Location: [facility] ENDOSCOPY;  Service: Endoscopy;  Laterality: N/A;  . TONSILLECTOMY  1975   Family History:  Family History  Problem Relation Age of Onset  . Hyperlipidemia Mother   . Hypertension Mother   . Heart disease Mother   . Alcohol abuse Father   . Cancer Father        breast  . Hyperlipidemia Father   . Depression Father   . Breast cancer Father 16       63 second occurance  . Cancer Sister        2 sisters with breast cancer  . Alcohol abuse Brother   . Drug abuse Brother   . ADD / ADHD Daughter   . Bipolar disorder Daughter   . Depression Daughter   . Breast cancer Sister 60  . Breast cancer Sister 22  . Alcohol abuse Brother   . Drug abuse Brother   . Cancer Paternal Aunt  breast   . Breast cancer Paternal Aunt 47  . Hyperlipidemia Maternal Grandmother   . Hypertension Maternal Grandmother   . Breast cancer Cousin 9       female  . Breast cancer Paternal Aunt 67  . Breast cancer Paternal Aunt 49  . Breast cancer Paternal Aunt 32   Social History:  Social History   Socioeconomic History  . Marital status: Divorced    Spouse name: None  . Number of children: 2  . Years of education: None  . Highest education level: Associate degree: occupational, Hotel manager, or vocational program  Social Needs  . Financial resource strain: Not hard at all  . Food insecurity - worry: Never true  . Food insecurity - inability: Never true  . Transportation needs - medical: No  . Transportation needs - non-medical: No  Occupational History  . Occupation: ER Tech    Comment: full time  Tobacco Use  . Smoking  status: Former Smoker    Packs/day: 1.00    Years: 35.00    Pack years: 35.00    Types: Cigarettes    Start date: 01/31/1966    Last attempt to quit: 02/01/1995    Years since quitting: 22.3  . Smokeless tobacco: Former Systems developer    Types: [place] date: 04/29/1989  Substance and Sexual Activity  . Alcohol use: No    Alcohol/week: 0.0 oz  . Drug use: No  . Sexual activity: Not Currently  Other Topics Concern  . None  Social History Narrative   Hobbies: grandchildren, guitar   Caffeine: 1 cup daily   Exercise: Not at present   Diet: needs improvement   Additional History:    Assessment:   Musculoskeletal: Strength & Muscle Tone: within normal limits Gait & Station: normal Patient leans: N/A  Psychiatric Specialty Exam: Medication Refill  Associated symptoms include coughing.  Anxiety  Symptoms include insomnia. Patient reports no nervous/anxious behavior or suicidal ideas.      Review of Systems  Respiratory: Positive for cough.   Psychiatric/Behavioral: Negative for hallucinations, memory loss, substance abuse and suicidal ideas. Depression:  it comes in 3 day periods each month. The patient has insomnia. The patient is not nervous/anxious.   All other systems reviewed and are negative.   Blood pressure (!) 169/93, pulse 80, temperature 97.8 F (36.6 C), temperature source Oral, weight 183 lb 12.8 oz (83.4 kg).Body mass index is 28.79 kg/m.  General Appearance: Well Groomed  Eye Contact:  Good  Speech:  Normal Rate  Volume:  Normal  Mood:  Okay  Affect:  Congruent  Thought Process:  Linear  Orientation:  Full (Time, Place, and Person)  Thought Content:  Negative  Suicidal Thoughts:  No  Homicidal Thoughts:  No  Memory:  Immediate;   Good Recent;   Good Remote;   Good  Judgement:  Good  Insight:  Good  Psychomotor Activity:  Normal  Concentration:  Good  Recall:  Good  Fund of Knowledge: Good  Language: Good  Akathisia:  Negative  Handed:  Right   AIMS (if indicated):  Done 02/01/15 normal  Assets:  Communication Skills Desire for Improvement Vocational/Educational  ADL's:  Intact  Cognition: WNL  Sleep:  poor   Is the patient at risk to self?  No. Has the patient been a risk to self in the past 6 months?  No. Has the patient been a risk to self within the distant past?  Yes.   Is the patient a  risk to others?  No. Has the patient been a risk to others in the past 6 months?  No. Has the patient been a risk to others within the distant past?  No.  Current Medications: Current Outpatient Medications  Medication Sig Dispense Refill  . acetaminophen (TYLENOL) 325 MG tablet Take 325 mg by mouth every 6 (six) hours as needed.    . ALPRAZolam (XANAX) 0.25 MG tablet Take 1 tablet (0.25 mg total) by mouth 2 (two) times daily as needed for anxiety. 60 tablet 1  . cetirizine (ZYRTEC) 10 MG tablet Take 10 mg by mouth daily.    . clobetasol cream (TEMOVATE) 0.05 % APPLY TOPICALLY 2 TIMES DAILY. 30 g 1  . NAPROXEN PO Take 1 tablet by mouth as needed.    Marland Kitchen omeprazole (PRILOSEC) 40 MG capsule TAKE 1 CAPSULE BY MOUTH DAILY 90 capsule 1  . ondansetron (ZOFRAN) 4 MG tablet Take 1 tablet (4 mg total) by mouth every 8 (eight) hours as needed for nausea or vomiting. 20 tablet 0  . triamcinolone cream (KENALOG) 0.1 % APPLY TOPICALLY 2 TIMES DAILY. 30 g 1  . venlafaxine XR (EFFEXOR XR) 75 MG 24 hr capsule Take 1 capsule (75 mg total) by mouth daily at 3 pm. 90 capsule 1  . venlafaxine XR (EFFEXOR-XR) 150 MG 24 hr capsule Take 1 capsule (150 mg total) by mouth daily with breakfast. 90 capsule 1   No current facility-administered medications for this visit.     Medical Decision Making:  Established Problem, Worsening (2), Review of Medication Regimen & Side Effects (2) and Review of New Medication or Change in Dosage (2)  Treatment Plan Summary:Medication management and Plan Plan   She will continue on Effexor XR 150  mg in the morning and 75 mg  in the evening.  She will continue on Xanax 0.25 mg twice daily as needed for anxiety.  D/C  Lithium   She will follow-up in 3  months or earlier depending on her symptoms    More than 50% of the time spent in psychoeducation, counseling and coordination of care.    This note was generated in part or whole with voice recognition software. Voice regonition is usually quite accurate but there are transcription errors that can and very often do occur. I apologize for any typographical errors that were not detected and corrected.   Rainey Pines, MD  05/27/2017, 10:57 AM

## 2017-08-19 ENCOUNTER — Encounter: Payer: Self-pay | Admitting: Psychiatry

## 2017-08-19 ENCOUNTER — Ambulatory Visit: Payer: BC Managed Care – PPO | Admitting: Psychiatry

## 2017-08-19 ENCOUNTER — Other Ambulatory Visit: Payer: Self-pay

## 2017-08-19 VITALS — BP 162/97 | HR 98 | Temp 98.0°F | Wt 179.2 lb

## 2017-08-19 DIAGNOSIS — F3481 Disruptive mood dysregulation disorder: Secondary | ICD-10-CM

## 2017-08-19 DIAGNOSIS — F411 Generalized anxiety disorder: Secondary | ICD-10-CM

## 2017-08-19 MED ORDER — VENLAFAXINE HCL ER 75 MG PO CP24: 75.0000 mg | ORAL_CAPSULE | Freq: Every day | ORAL | 1 refills | Status: DC

## 2017-08-19 MED ORDER — VENLAFAXINE HCL ER 150 MG PO CP24: 150.0000 mg | ORAL_CAPSULE | Freq: Every day | ORAL | 1 refills | Status: DC

## 2017-08-19 MED ORDER — ALPRAZOLAM 0.25 MG PO TABS: 0.2500 mg | ORAL_TABLET | Freq: Every evening | ORAL | 2 refills | Status: DC | PRN

## 2017-08-19 NOTE — Progress Notes (Signed)
[facility] MD/PA/NP OP Progress Note  08/19/2017 9:06 AM Mikayla Cuevas  MRN:  [id number]  Subjective: Patient is a 62 year old female with history of depression who presented for follow-up appointment. She reported that she has been feeling worse for the past couple of months due to increase in her allergy symptoms.  She has been trying different medications due to severe allergy.  However her symptoms are  improving at this time.  Patient reported that the Effexor is helping with her anxiety and depression.  She takes Xanax on a as needed basis.  She reported that she has noticed that her symptoms are getting better..  Patient was concerned about her job as she is having some conflict with 1 of the coworkers at this time.  She reported that she is trying to resolve the conflict.  Patient reported that she does not want to have any issues related to her job as she is trying to protect her position and to work well.  She is also going to have some change in her working hours and will be working in the evening shift.  .  She denied having any suicidal or homicidal ideations or plans.  She denied having any perceptual disturbances.         She takes Xanax on a when necessary basis. She denied having any side effects of the medications at this time.   She is receptive to her medications and has been compliant.  Chief Complaint:  Chief Complaint    Follow-up; Medication Refill     Visit Diagnosis:     ICD-10-CM   1. DMDD (disruptive mood dysregulation disorder) (HCC) F34.81   2. Anxiety state F41.1     Past Medical History:  Past Medical History:  Diagnosis Date  . Allergy   . Anxiety   . B12 deficiency   . Depression   . Diverticulitis   . GERD (gastroesophageal reflux disease)   . History of chicken pox   . History of colon polyps    Colonoscopy 2012 - repeat in 10 years - Dr. Vira Agar  . Hyperlipidemia   . Migraine   . Psoriasis    Clobetasol     Past Surgical History:  Procedure  Laterality Date  . ABDOMINAL HYSTERECTOMY    . BREAST CYST ASPIRATION Bilateral    multiple  times   . BREAST SURGERY    . CHOLECYSTECTOMY    . COLONOSCOPY WITH PROPOFOL N/A 04/02/2016   Procedure: COLONOSCOPY WITH PROPOFOL;  Surgeon: Manya Silvas, MD;  Location: [facility] ENDOSCOPY;  Service: Endoscopy;  Laterality: N/A;  . TONSILLECTOMY  1975   Family History:  Family History  Problem Relation Age of Onset  . Hyperlipidemia Mother   . Hypertension Mother   . Heart disease Mother   . Alcohol abuse Father   . Cancer Father        breast  . Hyperlipidemia Father   . Depression Father   . Breast cancer Father 10       63 second occurance  . Cancer Sister        2 sisters with breast cancer  . Alcohol abuse Brother   . Drug abuse Brother   . ADD / ADHD Daughter   . Bipolar disorder Daughter   . Depression Daughter   . Breast cancer Sister 16  . Breast cancer Sister 28  . Alcohol abuse Brother   . Drug abuse Brother   . Cancer Paternal Aunt  breast   . Breast cancer Paternal Aunt 16  . Hyperlipidemia Maternal Grandmother   . Hypertension Maternal Grandmother   . Breast cancer Cousin 79       female  . Breast cancer Paternal Aunt 81  . Breast cancer Paternal Aunt 74  . Breast cancer Paternal Aunt 49   Social History:  Social History   Socioeconomic History  . Marital status: Divorced    Spouse name: Not on file  . Number of children: 2  . Years of education: Not on file  . Highest education level: Associate degree: occupational, Hotel manager, or vocational program  Occupational History  . Occupation: ER Tech    Comment: full time  Social Needs  . Financial resource strain: Not hard at all  . Food insecurity:    Worry: Never true    Inability: Never true  . Transportation needs:    Medical: No    Non-medical: No  Tobacco Use  . Smoking status: Former Smoker    Packs/day: 1.00    Years: 35.00    Pack years: 35.00    Types: Cigarettes    Start date:  01/31/1966    Last attempt to quit: 02/01/1995    Years since quitting: 22.5  . Smokeless tobacco: Former Systems developer    Types: [address] date: 04/29/1989  Substance and Sexual Activity  . Alcohol use: No    Alcohol/week: 0.0 oz  . Drug use: No  . Sexual activity: Not Currently  Lifestyle  . Physical activity:    Days per week: 0 days    Minutes per session: 0 min  . Stress: Only a little  Relationships  . Social connections:    Talks on phone: Never    Gets together: Never    Attends religious service: Never    Active member of club or organization: No    Attends meetings of clubs or organizations: Never    Relationship status: Divorced  Other Topics Concern  . Not on file  Social History Narrative   Hobbies: grandchildren, guitar   Caffeine: 1 cup daily   Exercise: Not at present   Diet: needs improvement   Additional History:    Assessment:   Musculoskeletal: Strength & Muscle Tone: within normal limits Gait & Station: normal Patient leans: N/A  Psychiatric Specialty Exam: Medication Refill  Associated symptoms include coughing.  Anxiety  Symptoms include insomnia. Patient reports no nervous/anxious behavior or suicidal ideas.      Review of Systems  Respiratory: Positive for cough.   Psychiatric/Behavioral: Negative for hallucinations, memory loss, substance abuse and suicidal ideas. Depression:  it comes in 3 day periods each month. The patient has insomnia. The patient is not nervous/anxious.   All other systems reviewed and are negative.   Blood pressure (!) 162/97, pulse 98, temperature 98 F (36.7 C), temperature source Oral, weight 179 lb 3.2 oz (81.3 kg).Body mass index is 28.07 kg/m.  General Appearance: Well Groomed  Eye Contact:  Good  Speech:  Normal Rate  Volume:  Normal  Mood:  Okay  Affect:  Congruent  Thought Process:  Linear  Orientation:  Full (Time, Place, and Person)  Thought Content:  Negative  Suicidal Thoughts:  No  Homicidal  Thoughts:  No  Memory:  Immediate;   Good Recent;   Good Remote;   Good  Judgement:  Good  Insight:  Good  Psychomotor Activity:  Normal  Concentration:  Good  Recall:  Good  Fund of Knowledge:  Good  Language: Good  Akathisia:  Negative  Handed:  Right  AIMS (if indicated):  Done 02/01/15 normal  Assets:  Communication Skills Desire for Improvement Vocational/Educational  ADL's:  Intact  Cognition: WNL  Sleep:  poor   Is the patient at risk to self?  No. Has the patient been a risk to self in the past 6 months?  No. Has the patient been a risk to self within the distant past?  Yes.   Is the patient a risk to others?  No. Has the patient been a risk to others in the past 6 months?  No. Has the patient been a risk to others within the distant past?  No.  Current Medications: Current Outpatient Medications  Medication Sig Dispense Refill  . acetaminophen (TYLENOL) 325 MG tablet Take 325 mg by mouth every 6 (six) hours as needed.    . ALPRAZolam (XANAX) 0.25 MG tablet Take 1 tablet (0.25 mg total) by mouth 2 (two) times daily as needed for anxiety. 60 tablet 1  . cetirizine (ZYRTEC) 10 MG tablet Take 10 mg by mouth daily.    . clobetasol cream (TEMOVATE) 0.05 % APPLY TOPICALLY 2 TIMES DAILY. 30 g 1  . NAPROXEN PO Take 1 tablet by mouth as needed.    Marland Kitchen omeprazole (PRILOSEC) 40 MG capsule TAKE 1 CAPSULE BY MOUTH DAILY 90 capsule 1  . ondansetron (ZOFRAN) 4 MG tablet Take 1 tablet (4 mg total) by mouth every 8 (eight) hours as needed for nausea or vomiting. 20 tablet 0  . triamcinolone cream (KENALOG) 0.1 % APPLY TOPICALLY 2 TIMES DAILY. 30 g 1  . venlafaxine XR (EFFEXOR XR) 75 MG 24 hr capsule Take 1 capsule (75 mg total) by mouth daily at 3 pm. 90 capsule 1  . venlafaxine XR (EFFEXOR-XR) 150 MG 24 hr capsule Take 1 capsule (150 mg total) by mouth daily with breakfast. 90 capsule 1   No current facility-administered medications for this visit.     Medical Decision Making:   Established Problem, Worsening (2), Review of Medication Regimen & Side Effects (2) and Review of New Medication or Change in Dosage (2)  Treatment Plan Summary:Medication management and Plan Plan   She will continue on Effexor XR 150  mg in the morning and 75 mg in the evening.  She will continue on Xanax 0.25 mg twice daily as needed for anxiety.     She will follow-up in 3  months or earlier depending on her symptoms    More than 50% of the time spent in psychoeducation, counseling and coordination of care.    This note was generated in part or whole with voice recognition software. Voice regonition is usually quite accurate but there are transcription errors that can and very often do occur. I apologize for any typographical errors that were not detected and corrected.   Rainey Pines, MD  08/19/2017, 9:06 AM

## 2017-08-26 ENCOUNTER — Ambulatory Visit: Payer: BC Managed Care – PPO | Admitting: Psychiatry

## 2017-10-10 ENCOUNTER — Other Ambulatory Visit: Payer: Self-pay | Admitting: Internal Medicine

## 2017-11-11 ENCOUNTER — Ambulatory Visit: Payer: BC Managed Care – PPO | Admitting: Psychiatry

## 2017-11-18 ENCOUNTER — Ambulatory Visit: Payer: BC Managed Care – PPO | Admitting: Psychiatry

## 2017-12-16 ENCOUNTER — Other Ambulatory Visit: Payer: Self-pay

## 2017-12-16 ENCOUNTER — Encounter: Payer: Self-pay | Admitting: Psychiatry

## 2017-12-16 ENCOUNTER — Ambulatory Visit: Payer: BC Managed Care – PPO | Admitting: Psychiatry

## 2017-12-16 ENCOUNTER — Other Ambulatory Visit: Payer: Self-pay | Admitting: Internal Medicine

## 2017-12-16 VITALS — BP 151/91 | HR 74 | Temp 98.0°F | Wt 183.8 lb

## 2017-12-16 DIAGNOSIS — F3481 Disruptive mood dysregulation disorder: Secondary | ICD-10-CM | POA: Diagnosis not present

## 2017-12-16 DIAGNOSIS — Z1231 Encounter for screening mammogram for malignant neoplasm of breast: Secondary | ICD-10-CM

## 2017-12-16 MED ORDER — VENLAFAXINE HCL ER 75 MG PO CP24: 75.0000 mg | ORAL_CAPSULE | Freq: Every day | ORAL | 1 refills | Status: DC

## 2017-12-16 MED ORDER — VENLAFAXINE HCL ER 150 MG PO CP24: 150.0000 mg | ORAL_CAPSULE | Freq: Every day | ORAL | 1 refills | Status: DC

## 2017-12-16 MED ORDER — ALPRAZOLAM 0.25 MG PO TABS: 0.2500 mg | ORAL_TABLET | Freq: Every evening | ORAL | 2 refills | Status: AC | PRN

## 2017-12-16 NOTE — Progress Notes (Signed)
[facility] MD/PA/NP OP Progress Note  12/16/2017 1:26 PM Mikayla Cuevas  MRN:  [id number]  Subjective: Patient is a 62 year old female with history of depression who presented for follow-up appointment. She reported that she has been feeling tired as she was working last night.  She reported that she has been doing night shift which is now ending around 3 AM.  She has difficult time unwinding herself and going to sleep.  Patient reported that she occasionally takes Xanax at bedtime.  She currently denied having any side effects of the medications.  Patient reported that the Effexor has been helpful.  She reported that she has been helping her grandchildren as they have recently started her school.  Patient reported that she is involved in her family but she is focused in paying off her debt  and working long hours.  Reported that she is planning to work over the holidays.  She currently denied having any side effects of the medications.  She appeared calm and alert during the interview.  She does not have any acute symptoms at this time.  We discussed about the medications in detail and she is compliant with them.  No paranoia agitation or anger noted.      .  She denied having any suicidal or homicidal ideations or plans.  She denied having any perceptual disturbances.         She takes Xanax on a when necessary basis. She denied having any side effects of the medications at this time.   She is receptive to her medications and has been compliant.  Chief Complaint:  Chief Complaint    Follow-up; Medication Refill     Visit Diagnosis:     ICD-10-CM   1. DMDD (disruptive mood dysregulation disorder) (HCC) F34.81     Past Medical History:  Past Medical History:  Diagnosis Date  . Allergy   . Anxiety   . B12 deficiency   . Depression   . Diverticulitis   . GERD (gastroesophageal reflux disease)   . History of chicken pox   . History of colon polyps    Colonoscopy 2012 - repeat in 10 years  - Dr. Vira Agar  . Hyperlipidemia   . Migraine   . Psoriasis    Clobetasol     Past Surgical History:  Procedure Laterality Date  . ABDOMINAL HYSTERECTOMY    . BREAST CYST ASPIRATION Bilateral    multiple  times   . BREAST SURGERY    . CHOLECYSTECTOMY    . COLONOSCOPY WITH PROPOFOL N/A 04/02/2016   Procedure: COLONOSCOPY WITH PROPOFOL;  Surgeon: Manya Silvas, MD;  Location: [facility] ENDOSCOPY;  Service: Endoscopy;  Laterality: N/A;  . TONSILLECTOMY  1975   Family History:  Family History  Problem Relation Age of Onset  . Hyperlipidemia Mother   . Hypertension Mother   . Heart disease Mother   . Alcohol abuse Father   . Cancer Father        breast  . Hyperlipidemia Father   . Depression Father   . Breast cancer Father 34       63 second occurance  . Cancer Sister        2 sisters with breast cancer  . Alcohol abuse Brother   . Drug abuse Brother   . ADD / ADHD Daughter   . Bipolar disorder Daughter   . Depression Daughter   . Breast cancer Sister 64  . Breast cancer Sister 6  . Alcohol abuse Brother   .  Drug abuse Brother   . Cancer Paternal Aunt        breast   . Breast cancer Paternal Aunt 58  . Hyperlipidemia Maternal Grandmother   . Hypertension Maternal Grandmother   . Breast cancer Cousin 53       female  . Breast cancer Paternal Aunt 65  . Breast cancer Paternal Aunt 66  . Breast cancer Paternal Aunt 62   Social History:  Social History   Socioeconomic History  . Marital status: Divorced    Spouse name: Not on file  . Number of children: 2  . Years of education: Not on file  . Highest education level: Associate degree: occupational, Hotel manager, or vocational program  Occupational History  . Occupation: ER Tech    Comment: full time  Social Needs  . Financial resource strain: Not hard at all  . Food insecurity:    Worry: Never true    Inability: Never true  . Transportation needs:    Medical: No    Non-medical: No  Tobacco Use  . Smoking  status: Former Smoker    Packs/day: 1.00    Years: 35.00    Pack years: 35.00    Types: Cigarettes    Start date: 01/31/1966    Last attempt to quit: 02/01/1995    Years since quitting: 22.8  . Smokeless tobacco: Former Systems developer    Types: [place] date: 04/29/1989  Substance and Sexual Activity  . Alcohol use: No    Alcohol/week: 0.0 standard drinks  . Drug use: No  . Sexual activity: Not Currently  Lifestyle  . Physical activity:    Days per week: 0 days    Minutes per session: 0 min  . Stress: Only a little  Relationships  . Social connections:    Talks on phone: Never    Gets together: Never    Attends religious service: Never    Active member of club or organization: No    Attends meetings of clubs or organizations: Never    Relationship status: Divorced  Other Topics Concern  . Not on file  Social History Narrative   Hobbies: grandchildren, guitar   Caffeine: 1 cup daily   Exercise: Not at present   Diet: needs improvement   Additional History:    Assessment:   Musculoskeletal: Strength & Muscle Tone: within normal limits Gait & Station: normal Patient leans: N/A  Psychiatric Specialty Exam: Medication Refill  Associated symptoms include coughing. Pertinent negatives include no neck pain or vomiting.  Anxiety  Symptoms include insomnia. Patient reports no nervous/anxious behavior, palpitations or suicidal ideas.      Review of Systems  Constitutional: Positive for malaise/fatigue. Negative for weight loss.  HENT: Negative for sinus pain and tinnitus.   Eyes: Negative for double vision.  Respiratory: Positive for cough. Negative for sputum production.   Cardiovascular: Negative for palpitations.  Gastrointestinal: Negative for vomiting.  Genitourinary: Negative for urgency.  Musculoskeletal: Negative for neck pain.  Endo/Heme/Allergies: Negative for environmental allergies.  Psychiatric/Behavioral: Negative for hallucinations, memory loss, substance  abuse and suicidal ideas. Depression:  it comes in 3 day periods each month. The patient has insomnia. The patient is not nervous/anxious.   All other systems reviewed and are negative.   Blood pressure (!) 151/91, pulse 74, temperature 98 F (36.7 C), weight 183 lb 12.8 oz (83.4 kg).Body mass index is 28.79 kg/m.  General Appearance: Well Groomed  Eye Contact:  Good  Speech:  Normal Rate  Volume:  Normal  Mood:  Okay  Affect:  Congruent  Thought Process:  Linear  Orientation:  Full (Time, Place, and Person)  Thought Content:  Negative  Suicidal Thoughts:  No  Homicidal Thoughts:  No  Memory:  Immediate;   Good Recent;   Good Remote;   Good  Judgement:  Good  Insight:  Good  Psychomotor Activity:  Normal  Concentration:  Good  Recall:  Good  Fund of Knowledge: Good  Language: Good  Akathisia:  Negative  Handed:  Right  AIMS (if indicated):  Done 02/01/15 normal  Assets:  Communication Skills Desire for Improvement Vocational/Educational  ADL's:  Intact  Cognition: WNL  Sleep:  poor   Is the patient at risk to self?  No. Has the patient been a risk to self in the past 6 months?  No. Has the patient been a risk to self within the distant past?  Yes.   Is the patient a risk to others?  No. Has the patient been a risk to others in the past 6 months?  No. Has the patient been a risk to others within the distant past?  No.  Current Medications: Current Outpatient Medications  Medication Sig Dispense Refill  . acetaminophen (TYLENOL) 325 MG tablet Take 325 mg by mouth every 6 (six) hours as needed.    . ALPRAZolam (XANAX) 0.25 MG tablet Take 1 tablet (0.25 mg total) by mouth at bedtime as needed for anxiety. 30 tablet 2  . cetirizine (ZYRTEC) 10 MG tablet Take 10 mg by mouth daily.    . clobetasol cream (TEMOVATE) 0.05 % APPLY TOPICALLY 2 TIMES DAILY. 30 g 1  . estradiol (ESTRACE VAGINAL) 0.1 MG/GM vaginal cream 1 application vaginally twice a week as needed    .  lamoTRIgine (LAMICTAL) 25 MG tablet TAKE ONE TABLET DAILY FOR 2 WEEKS THEN TAKE 2 TABLETS (50MG ) DAILY    . lithium carbonate 150 MG capsule TAKE 1 CAPSULE BY MOUTH TWICE DAILY WITH A MEAL    . NAPROXEN PO Take 1 tablet by mouth as needed.    Marland Kitchen omega-3 acid ethyl esters (LOVAZA) 1 g capsule Take 1 capsule twice daily    . omeprazole (PRILOSEC) 40 MG capsule TAKE 1 CAPSULE BY MOUTH DAILY 90 capsule PRN  . ondansetron (ZOFRAN) 4 MG tablet Take 1 tablet (4 mg total) by mouth every 8 (eight) hours as needed for nausea or vomiting. 20 tablet 0  . triamcinolone cream (KENALOG) 0.1 % APPLY TOPICALLY 2 TIMES DAILY. 30 g 1  . venlafaxine XR (EFFEXOR XR) 75 MG 24 hr capsule Take 1 capsule (75 mg total) by mouth daily at 3 pm. 90 capsule 1  . venlafaxine XR (EFFEXOR-XR) 150 MG 24 hr capsule Take 1 capsule (150 mg total) by mouth daily with breakfast. 90 capsule 1   No current facility-administered medications for this visit.     Medical Decision Making:  Established Problem, Worsening (2), Review of Medication Regimen & Side Effects (2) and Review of New Medication or Change in Dosage (2)  Treatment Plan Summary:Medication management and Plan Plan   She will continue on Effexor XR 150  mg in the morning and 75 mg in the evening.  She will continue on Xanax 0.25 mg twice daily as needed for anxiety.   She will follow-up in 3  months or earlier depending on her symptoms    More than 50% of the time spent in psychoeducation, counseling and coordination of care.    This note  was generated in part or whole with voice recognition software. Voice regonition is usually quite accurate but there are transcription errors that can and very often do occur. I apologize for any typographical errors that were not detected and corrected.   Rainey Pines, MD  12/16/2017, 1:26 PM

## 2018-01-29 ENCOUNTER — Ambulatory Visit
Admission: RE | Admit: 2018-01-29 | Discharge: 2018-01-29 | Disposition: A | Discharge status: Home / Self Care | Payer: BC Managed Care – PPO | Source: Ambulatory Visit | Attending: Internal Medicine | Admitting: Internal Medicine

## 2018-01-29 ENCOUNTER — Other Ambulatory Visit: Payer: Self-pay | Admitting: Internal Medicine

## 2018-01-29 DIAGNOSIS — Z1231 Encounter for screening mammogram for malignant neoplasm of breast: Secondary | ICD-10-CM | POA: Insufficient documentation

## 2018-01-29 DIAGNOSIS — R921 Mammographic calcification found on diagnostic imaging of breast: Secondary | ICD-10-CM

## 2018-01-29 DIAGNOSIS — R928 Other abnormal and inconclusive findings on diagnostic imaging of breast: Secondary | ICD-10-CM

## 2018-02-10 ENCOUNTER — Ambulatory Visit: Payer: BC Managed Care – PPO | Admitting: Psychiatry

## 2018-02-11 ENCOUNTER — Ambulatory Visit
Admission: RE | Admit: 2018-02-11 | Discharge: 2018-02-11 | Disposition: A | Discharge status: Home / Self Care | Payer: BC Managed Care – PPO | Source: Ambulatory Visit | Attending: Internal Medicine | Admitting: Internal Medicine

## 2018-02-11 DIAGNOSIS — R921 Mammographic calcification found on diagnostic imaging of breast: Secondary | ICD-10-CM | POA: Insufficient documentation

## 2018-02-11 DIAGNOSIS — R928 Other abnormal and inconclusive findings on diagnostic imaging of breast: Secondary | ICD-10-CM | POA: Insufficient documentation

## 2018-02-17 ENCOUNTER — Ambulatory Visit: Payer: BC Managed Care – PPO | Admitting: Psychiatry

## 2018-02-17 ENCOUNTER — Other Ambulatory Visit: Payer: Self-pay

## 2018-02-17 ENCOUNTER — Encounter: Payer: Self-pay | Admitting: Psychiatry

## 2018-02-17 VITALS — BP 152/91 | HR 88 | Temp 97.8°F | Wt 184.2 lb

## 2018-02-17 DIAGNOSIS — F411 Generalized anxiety disorder: Secondary | ICD-10-CM

## 2018-02-17 DIAGNOSIS — F331 Major depressive disorder, recurrent, moderate: Secondary | ICD-10-CM | POA: Diagnosis not present

## 2018-02-17 MED ORDER — VENLAFAXINE HCL ER 150 MG PO CP24: 150.0000 mg | ORAL_CAPSULE | Freq: Every day | ORAL | 1 refills | Status: AC

## 2018-02-17 MED ORDER — VENLAFAXINE HCL ER 75 MG PO CP24: 75.0000 mg | ORAL_CAPSULE | Freq: Every day | ORAL | 1 refills | Status: AC

## 2018-02-17 NOTE — Progress Notes (Signed)
[place] MD/PA/NP OP Progress Note  02/17/2018 9:16 AM Mikayla Cuevas  MRN:  [id number]  Subjective: Patient is a 62 year old female with history of depression who presented for follow-up appointment. She reported that she has been doing well but having some problems with her job.  She reported that they were not giving her enough shifts.  She is planning to start 12-hour shifts from next month as she cannot keep up with her hours during the winter.  She reported that the change in the medication has helped her and her depression is improving.  She is taking Xanax only on a as needed basis.  She reported that she does not take it on a regular basis.  She has enough refills on the Xanax.  Patient reported that the Effexor has helped with her anxiety symptoms and she is feeling stable.  She currently denied having any suicidal ideation or plans.  He is going to have her physical exam done next month with her PCP and was asking about the test needed to be done.  We discussed about the routine and preventive exams in detail.  She agreed with the time.  She is compliant with her medications.  She denied having any suicidal homicidal ideations or plans at this time.  .   She is receptive to her medications and has been compliant.  Chief Complaint:  Chief Complaint    Follow-up; Medication Refill     Visit Diagnosis:     ICD-10-CM   1. DMDD (disruptive mood dysregulation disorder) (HCC) F34.81   2. Anxiety state F41.1     Past Medical History:  Past Medical History:  Diagnosis Date  . Allergy   . Anxiety   . B12 deficiency   . Depression   . Diverticulitis   . GERD (gastroesophageal reflux disease)   . History of chicken pox   . History of colon polyps    Colonoscopy 2012 - repeat in 10 years - Dr. Vira Agar  . Hyperlipidemia   . Migraine   . Psoriasis    Clobetasol     Past Surgical History:  Procedure Laterality Date  . ABDOMINAL HYSTERECTOMY    . BREAST CYST ASPIRATION Bilateral    multiple  times   . BREAST SURGERY    . CHOLECYSTECTOMY    . COLONOSCOPY WITH PROPOFOL N/A 04/02/2016   Procedure: COLONOSCOPY WITH PROPOFOL;  Surgeon: Manya Silvas, MD;  Location[facility] ENDOSCOPY;  Service: Endoscopy;  Laterality: N/A;  . TONSILLECTOMY  1975   Family History:  Family History  Problem Relation Age of Onset  . Hyperlipidemia Mother   . Hypertension Mother   . Heart disease Mother   . Alcohol abuse Father   . Cancer Father        breast  . Hyperlipidemia Father   . Depression Father   . Breast cancer Father 52       63 second occurance  . Cancer Sister        2 sisters with breast cancer  . Alcohol abuse Brother   . Drug abuse Brother   . ADD / ADHD Daughter   . Bipolar disorder Daughter   . Depression Daughter   . Breast cancer Sister 20  . Breast cancer Sister 18  . Alcohol abuse Brother   . Drug abuse Brother   . Cancer Paternal Aunt        breast   . Breast cancer Paternal Aunt 75  . Hyperlipidemia Maternal Grandmother   .  Hypertension Maternal Grandmother   . Breast cancer Cousin 56       female  . Breast cancer Paternal Aunt 28  . Breast cancer Paternal Aunt 78  . Breast cancer Paternal Aunt 55  . BRCA 1/2 Neg Hx    Social History:  Social History   Socioeconomic History  . Marital status: Divorced    Spouse name: Not on file  . Number of children: 2  . Years of education: Not on file  . Highest education level: Associate degree: occupational, Hotel manager, or vocational program  Occupational History  . Occupation: ER Tech    Comment: full time  Social Needs  . Financial resource strain: Not hard at all  . Food insecurity:    Worry: Never true    Inability: Never true  . Transportation needs:    Medical: No    Non-medical: No  Tobacco Use  . Smoking status: Former Smoker    Packs/day: 1.00    Years: 35.00    Pack years: 35.00    Types: Cigarettes    Start date: 01/31/1966    Last attempt to quit: 02/01/1995    Years since  quitting: 23.0  . Smokeless tobacco: Former Systems developer    Types: [place] date: 04/29/1989  Substance and Sexual Activity  . Alcohol use: No    Alcohol/week: 0.0 standard drinks  . Drug use: No  . Sexual activity: Not Currently  Lifestyle  . Physical activity:    Days per week: 0 days    Minutes per session: 0 min  . Stress: Only a little  Relationships  . Social connections:    Talks on phone: Never    Gets together: Never    Attends religious service: Never    Active member of club or organization: No    Attends meetings of clubs or organizations: Never    Relationship status: Divorced  Other Topics Concern  . Not on file  Social History Narrative   Hobbies: grandchildren, guitar   Caffeine: 1 cup daily   Exercise: Not at present   Diet: needs improvement   Additional History:    Assessment:   Musculoskeletal: Strength & Muscle Tone: within normal limits Gait & Station: normal Patient leans: N/A  Psychiatric Specialty Exam: Medication Refill  Associated symptoms include coughing. Pertinent negatives include no neck pain or vomiting.  Anxiety  Symptoms include insomnia. Patient reports no nervous/anxious behavior, palpitations or suicidal ideas.      Review of Systems  Constitutional: Positive for malaise/fatigue. Negative for weight loss.  HENT: Negative for sinus pain and tinnitus.   Eyes: Negative for double vision.  Respiratory: Positive for cough. Negative for sputum production.   Cardiovascular: Negative for palpitations.  Gastrointestinal: Negative for vomiting.  Genitourinary: Negative for urgency.  Musculoskeletal: Negative for neck pain.  Endo/Heme/Allergies: Negative for environmental allergies.  Psychiatric/Behavioral: Negative for hallucinations, memory loss, substance abuse and suicidal ideas. Depression:  it comes in 3 day periods each month. The patient has insomnia. The patient is not nervous/anxious.   All other systems reviewed and are  negative.   Blood pressure (!) 152/91, pulse 88, temperature 97.8 F (36.6 C), temperature source Oral, weight 184 lb 3.2 oz (83.6 kg).Body mass index is 28.85 kg/m.  General Appearance: Well Groomed  Eye Contact:  Good  Speech:  Normal Rate  Volume:  Normal  Mood:  Okay  Affect:  Congruent  Thought Process:  Linear  Orientation:  Full (Time, Place, and Person)  Thought Content:  Negative  Suicidal Thoughts:  No  Homicidal Thoughts:  No  Memory:  Immediate;   Good Recent;   Good Remote;   Good  Judgement:  Good  Insight:  Good  Psychomotor Activity:  Normal  Concentration:  Good  Recall:  Good  Fund of Knowledge: Good  Language: Good  Akathisia:  Negative  Handed:  Right  AIMS (if indicated):  Done 02/01/15 normal  Assets:  Communication Skills Desire for Improvement Vocational/Educational  ADL's:  Intact  Cognition: WNL  Sleep:  poor   Is the patient at risk to self?  No. Has the patient been a risk to self in the past 6 months?  No. Has the patient been a risk to self within the distant past?  Yes.   Is the patient a risk to others?  No. Has the patient been a risk to others in the past 6 months?  No. Has the patient been a risk to others within the distant past?  No.  Current Medications: Current Outpatient Medications  Medication Sig Dispense Refill  . acetaminophen (TYLENOL) 325 MG tablet Take 325 mg by mouth every 6 (six) hours as needed.    . ALPRAZolam (XANAX) 0.25 MG tablet Take 1 tablet (0.25 mg total) by mouth at bedtime as needed for anxiety. 30 tablet 2  . cetirizine (ZYRTEC) 10 MG tablet Take 10 mg by mouth daily.    . clobetasol cream (TEMOVATE) 0.05 % APPLY TOPICALLY 2 TIMES DAILY. 30 g 1  . estradiol (ESTRACE VAGINAL) 0.1 MG/GM vaginal cream 1 application vaginally twice a week as needed    . NAPROXEN PO Take 1 tablet by mouth as needed.    Marland Kitchen omeprazole (PRILOSEC) 40 MG capsule TAKE 1 CAPSULE BY MOUTH DAILY 90 capsule PRN  . triamcinolone cream  (KENALOG) 0.1 % APPLY TOPICALLY 2 TIMES DAILY. 30 g 1  . venlafaxine XR (EFFEXOR XR) 75 MG 24 hr capsule Take 1 capsule (75 mg total) by mouth daily at 3 pm. 90 capsule 1  . venlafaxine XR (EFFEXOR-XR) 150 MG 24 hr capsule Take 1 capsule (150 mg total) by mouth daily with breakfast. 90 capsule 1   No current facility-administered medications for this visit.     Medical Decision Making:  Established Problem, Worsening (2), Review of Medication Regimen & Side Effects (2) and Review of New Medication or Change in Dosage (2)  Treatment Plan Summary:Medication management and Plan Plan   She will continue on Effexor XR 150  mg in the morning and 75 mg in the evening.  She will continue on Xanax 0.25 mg twice daily as needed for anxiety.   She will follow-up in 3  months or earlier depending on her symptoms    More than 50% of the time spent in psychoeducation, counseling and coordination of care.    This note was generated in part or whole with voice recognition software. Voice regonition is usually quite accurate but there are transcription errors that can and very often do occur. I apologize for any typographical errors that were not detected and corrected.   Rainey Pines, MD  02/17/2018, 9:16 AM

## 2018-03-24 ENCOUNTER — Ambulatory Visit (INDEPENDENT_AMBULATORY_CARE_PROVIDER_SITE_OTHER): Payer: BC Managed Care – PPO

## 2018-03-24 ENCOUNTER — Ambulatory Visit: Payer: BC Managed Care – PPO | Admitting: Internal Medicine

## 2018-03-24 ENCOUNTER — Encounter: Payer: Self-pay | Admitting: Internal Medicine

## 2018-03-24 VITALS — BP 156/90 | HR 70 | Temp 97.7°F | Resp 15 | Ht 67.0 in | Wt 184.6 lb

## 2018-03-24 DIAGNOSIS — M545 Low back pain, unspecified: Secondary | ICD-10-CM

## 2018-03-24 DIAGNOSIS — M5412 Radiculopathy, cervical region: Secondary | ICD-10-CM

## 2018-03-24 DIAGNOSIS — G8929 Other chronic pain: Secondary | ICD-10-CM

## 2018-03-24 DIAGNOSIS — R109 Unspecified abdominal pain: Secondary | ICD-10-CM | POA: Diagnosis not present

## 2018-03-24 DIAGNOSIS — M130 Polyarthritis, unspecified: Secondary | ICD-10-CM | POA: Diagnosis not present

## 2018-03-24 DIAGNOSIS — M5441 Lumbago with sciatica, right side: Secondary | ICD-10-CM

## 2018-03-24 DIAGNOSIS — R918 Other nonspecific abnormal finding of lung field: Secondary | ICD-10-CM | POA: Insufficient documentation

## 2018-03-24 DIAGNOSIS — R030 Elevated blood-pressure reading, without diagnosis of hypertension: Secondary | ICD-10-CM

## 2018-03-24 DIAGNOSIS — M542 Cervicalgia: Secondary | ICD-10-CM

## 2018-03-24 DIAGNOSIS — R110 Nausea: Secondary | ICD-10-CM | POA: Diagnosis not present

## 2018-03-24 LAB — COMPREHENSIVE METABOLIC PANEL
ALBUMIN: 4.4 g/dL (ref 3.5–5.2)
ALK PHOS: 89 U/L (ref 39–117)
ALT: 27 U/L (ref 0–35)
AST: 33 U/L (ref 0–37)
BILIRUBIN TOTAL: 0.5 mg/dL (ref 0.2–1.2)
BUN: 20 mg/dL (ref 6–23)
CALCIUM: 9.5 mg/dL (ref 8.4–10.5)
CO2: 29 mEq/L (ref 19–32)
CREATININE: 1 mg/dL (ref 0.40–1.20)
Chloride: 100 mEq/L (ref 96–112)
GFR: 59.18 mL/min — ABNORMAL LOW (ref >60.00)
Glucose, Bld: 104 mg/dL — ABNORMAL HIGH (ref 70–99)
Potassium: 3.7 mEq/L (ref 3.5–5.1)
Sodium: 139 mEq/L (ref 135–145)
Total Protein: 7.7 g/dL (ref 6.0–8.3)

## 2018-03-24 LAB — URINALYSIS, ROUTINE W REFLEX MICROSCOPIC
Hgb urine dipstick: NEGATIVE
KETONES UR: NEGATIVE
Leukocytes, UA: NEGATIVE
NITRITE: NEGATIVE
Specific Gravity, Urine: >=1.03 — AB (ref 1.000–1.030)
TOTAL PROTEIN, URINE-UPE24: NEGATIVE
UROBILINOGEN UA: 0.2 (ref 0.0–1.0)
Urine Glucose: NEGATIVE
pH: 5.5 (ref 5.0–8.0)

## 2018-03-24 LAB — CBC WITH DIFFERENTIAL/PLATELET
BASOS ABS: 0 10*3/uL (ref 0.0–0.1)
Basophils Relative: 0.5 % (ref 0.0–3.0)
EOS ABS: 0.2 10*3/uL (ref 0.0–0.7)
Eosinophils Relative: 1.9 % (ref 0.0–5.0)
HEMATOCRIT: 40.5 % (ref 36.0–46.0)
HEMOGLOBIN: 13.1 g/dL (ref 12.0–15.0)
LYMPHS PCT: 36.1 % (ref 12.0–46.0)
Lymphs Abs: 2.9 10*3/uL (ref 0.7–4.0)
MCHC: 32.2 g/dL (ref 30.0–36.0)
MCV: 84.1 fl (ref 78.0–100.0)
Monocytes Absolute: 0.8 10*3/uL (ref 0.1–1.0)
Monocytes Relative: 9.9 % (ref 3.0–12.0)
Neutro Abs: 4.2 10*3/uL (ref 1.4–7.7)
Neutrophils Relative %: 51.6 % (ref 43.0–77.0)
PLATELETS: 286 10*3/uL (ref 150.0–400.0)
RBC: 4.81 Mil/uL (ref 3.87–5.11)
RDW: 14.9 % (ref 11.5–15.5)
WBC: 8.1 10*3/uL (ref 4.0–10.5)

## 2018-03-24 LAB — CK: Total CK: 153 U/L (ref 7–177)

## 2018-03-24 LAB — MICROALBUMIN / CREATININE URINE RATIO
Creatinine,U: 263.8 mg/dL
Microalb Creat Ratio: 0.8 mg/g (ref 0.0–30.0)
Microalb, Ur: 2 mg/dL — ABNORMAL HIGH (ref 0.0–1.9)

## 2018-03-24 LAB — LIPID PANEL
Cholesterol: 261 mg/dL — ABNORMAL HIGH (ref 0–200)
HDL: 51 mg/dL (ref >39.00)
LDL Cholesterol: 180 mg/dL — ABNORMAL HIGH (ref 0–99)
NonHDL: 209.8
Total CHOL/HDL Ratio: 5
Triglycerides: 147 mg/dL (ref 0.0–149.0)
VLDL: 29.4 mg/dL (ref 0.0–40.0)

## 2018-03-24 LAB — SEDIMENTATION RATE: Sed Rate: 30 mm/hr (ref 0–30)

## 2018-03-24 MED ORDER — AMLODIPINE BESYLATE 2.5 MG PO TABS: 2.5000 mg | ORAL_TABLET | Freq: Every day | ORAL | 0 refills | Status: AC

## 2018-03-24 NOTE — Assessment & Plan Note (Addendum)
continue PPI  Avoiding NSAIDs.  GI referral made  h pylori titers ordered   Lab Results  Component Value Date   ALT 27 03/24/2018   AST 33 03/24/2018   ALKPHOS 89 03/24/2018   BILITOT 0.5 03/24/2018

## 2018-03-24 NOTE — Progress Notes (Signed)
Subjective:  Patient ID: Mikayla Cuevas, female    DOB: February 08, 1956  Age: 62 y.o. MRN: [id number]  CC: The primary encounter diagnosis was Cervical radiculitis. Diagnoses of Polyarthritis, Chronic right-sided low back pain with right-sided sciatica, Chronic left flank pain, Elevated blood pressure reading in office without diagnosis of hypertension, Chronic nausea, Low back pain potentially associated with radiculopathy, Cervicalgia of occipito-atlanto-axial region, Hilar mass, and Nausea in adult were also pertinent to this visit.  HPI Mikayla Cuevas presents for  CPE but has not been seen in nearly 2 years and has multiple complaint.s  1)  6 month history of back pain . locallized to Lumbar  Area .  Started in the middle of the night,  Severe,  woke her up .   radiates to left flank;   states that she is tender from spine to left CVA , and numb feeling from  Ef CVA to umbilicus.  Denies hematuria,  change in bowel habits ( moves 2 times daily,  Then Skips a day).  Onset was not preceded by unusual activity.  No rash,  But wonders if pain could be from shingles,   Works in [place]  2) Has a separate discrete pain complaint involving the coccygeal area brought on by pushing a light bedside cart around and radiates up her spine . occurs 2-3 times per week.  Pain "takes her breath away." the Does not radiate down either leg.  Did not seek medical attention bc she states that when she called for an appointment she was given one for 3 weeks out,  Not given an alternative with another provider     Exam noticeable to tenderness  in previously described areas , muslce spasm diffuse   3) chronic nausea .  Recurrent,  Aggravated by certain smells,  Thinking about certain foods.  Has not seen GI   In years .  Advised at time of colonoscopy to double  her dose of prilosec (2 years ago) which has helped "a little"  But continues to report significant nausea lasting most of the day , unable to eat on those days.  No  significant weight loss despite reporting meals missed   3)  Missed 5 days of work 3 weeks ago   Had a erythematous tender  rash on tip of nose and inside nose acc'd by watery diarrhea and severe nausea without vomiting (some dry heaves)  The Diarrhea lasted 4 days despite trying a prudent diet .  Lost 7 lbs during that period .Treid to get an appointment , was told it would be 3 weeks  .  Was not offered an alternative  With another provider   .    4) an episode of facial swelling involving the eyelids  "some kind of allergic reaction" was treated in [facility] ER with prednisone .   Summer 2018     5) has constant headaches,  Body pain .  Remote history of migraines.  Affects back of neck radiates to right side of head.  Both shoulders hurt in the morning,  But improve with movement..Right worse than left.  Exercise helps.  Sleeps on back and side .  Relatively new mattress.    Takes 2 tylenol with one naproxen  Averages use of naproxen  less than 4 days per week  Does not aggravate her nausea. Last colonoscopy was done by [facility] .      Outpatient Medications Prior to Visit  Medication Sig Dispense Refill  . acetaminophen (  TYLENOL) 325 MG tablet Take 325 mg by mouth every 6 (six) hours as needed.    . ALPRAZolam (XANAX) 0.25 MG tablet Take 1 tablet (0.25 mg total) by mouth at bedtime as needed for anxiety. 30 tablet 2  . cetirizine (ZYRTEC) 10 MG tablet Take 10 mg by mouth daily.    . clobetasol cream (TEMOVATE) 0.05 % APPLY TOPICALLY 2 TIMES DAILY. 30 g 1  . NAPROXEN PO Take 1 tablet by mouth as needed.    Marland Kitchen omeprazole (PRILOSEC) 40 MG capsule TAKE 1 CAPSULE BY MOUTH DAILY 90 capsule PRN  . triamcinolone cream (KENALOG) 0.1 % APPLY TOPICALLY 2 TIMES DAILY. 30 g 1  . venlafaxine XR (EFFEXOR XR) 75 MG 24 hr capsule Take 1 capsule (75 mg total) by mouth daily at 3 pm. 90 capsule 1  . venlafaxine XR (EFFEXOR-XR) 150 MG 24 hr capsule Take 1 capsule (150 mg total) by mouth daily with breakfast. 90  capsule 1  . estradiol (ESTRACE VAGINAL) 0.1 MG/GM vaginal cream 1 application vaginally twice a week as needed     No facility-administered medications prior to visit.     Review of Systems;  Patient denies headache, fevers, malaise, unintentional weight loss, skin rash, eye pain, sinus congestion and sinus pain, sore throat, dysphagia,  hemoptysis , cough, dyspnea, wheezing, chest pain, palpitations, orthopnea, edema, melena, diarrhea, constipation, dysuria, hematuria, urinary  Frequency, nocturia, numbness, tingling, seizures,  Focal weakness, Loss of consciousness,  Tremor, insomnia, depression, anxiety, and suicidal ideation.      Objective:  BP (!) 156/90 (BP Location: Left Arm, Patient Position: Sitting, Cuff Size: Normal)   Pulse 70   Temp 97.7 F (36.5 C) (Oral)   Resp 15   Ht _0  (1.702 m)   Wt 184 lb 9.6 oz (83.7 kg)   SpO2 96%   BMI 28.91 kg/m   BP Readings from Last 3 Encounters:  03/24/18 (!) 156/90  02/17/18 (!) 152/91  12/16/17 (!) 151/91    Wt Readings from Last 3 Encounters:  03/24/18 184 lb 9.6 oz (83.7 kg)  02/17/18 184 lb 3.2 oz (83.6 kg)  12/16/17 183 lb 12.8 oz (83.4 kg)    General appearance: alert, cooperative and appears stated age Ears: normal TM's and external ear canals both ears Throat: lips, mucosa, and tongue normal; teeth and gums normal Neck: no adenopathy, no carotid bruit, supple, symmetrical, trachea midline and thyroid not enlarged, symmetric, no tenderness/mass/nodules Back: symmetric, slight leftward curvature, .paraspinus and left CVA tenderness. Lungs: clear to auscultation bilaterally Heart: regular rate and rhythm, S1, S2 normal, no murmur, click, rub or gallop Abdomen: soft, non-tender; bowel sounds normal; no masses,  no organomegaly Pulses: 2+ and symmetric Skin: Skin color, texture, turgor normal. No rashes or lesions Lymph nodes: Cervical, supraclavicular, and axillary nodes normal. Neuro: CNs 2-12 intact. DTRs 4+/4 in  patellars,  2+/4 biceps, brachioradialis,  and achilles. Muscle strength 5/5 in upper and lower exremities. Fine resting tremor bilaterally both hands cerebellar function normal. Romberg negative.  No pronator drift.   Gait normal.   No results found for: HGBA1C  Lab Results  Component Value Date   CREATININE 1.00 03/24/2018   CREATININE 0.9 04/08/2014   CREATININE 0.8 10/28/2013    Lab Results  Component Value Date   WBC 8.1 03/24/2018   HGB 13.1 03/24/2018   HCT 40.5 03/24/2018   PLT 286.0 03/24/2018   GLUCOSE 104 (H) 03/24/2018   CHOL 261 (H) 03/24/2018   TRIG 147.0 03/24/2018  HDL 51.00 03/24/2018   LDLDIRECT 222.9 04/08/2014   LDLCALC 180 (H) 03/24/2018   ALT 27 03/24/2018   AST 33 03/24/2018   NA 139 03/24/2018   K 3.7 03/24/2018   CL 100 03/24/2018   CREATININE 1.00 03/24/2018   BUN 20 03/24/2018   CO2 29 03/24/2018   TSH 1.20 01/17/2015   MICROALBUR 2.0 (H) 03/24/2018    Mm Digital Diagnostic Unilat R  Result Date: 02/11/2018 CLINICAL DATA:  Patient returns today to evaluate RIGHT breast calcifications identified on recent screening mammogram. EXAM: DIGITAL DIAGNOSTIC RIGHT MAMMOGRAM WITH CAD COMPARISON:  Previous exams including recent screening mammogram dated 01/29/2018. ACR Breast Density Category c: The breast tissue is heterogeneously dense, which may obscure small masses. FINDINGS: On today's additional diagnostic views with magnification, there are benign-appearing punctate calcifications which are scattered throughout the fibroglandular tissues of the RIGHT breast with a benign distribution. No suspicious pleomorphic or linear branching calcifications are identified. Mammographic images were processed with CAD. IMPRESSION: No evidence of malignancy. Benign calcifications within the RIGHT breast. Patient may return to routine annual bilateral screening mammogram schedule. RECOMMENDATION: Screening mammogram in one year.(Code:[id number]-B-01Y) I have discussed the  findings and recommendations with the patient. Results were also provided in writing at the conclusion of the visit. If applicable, a reminder letter will be sent to the patient regarding the next appointment. BI-RADS CATEGORY  2: Benign. Electronically Signed   By: Franki Cabot M.D.   On: 02/11/2018 10:29    Assessment & Plan:   Problem List Items Addressed This Visit    Cervicalgia of occipito-atlanto-axial region    Degenerative changes seen at multiple  levels .        Hilar mass    Incidental finding on cervical spine films,  Suspicious for neoplastic process.  CT ordered       Relevant Orders   CT Chest Wo Contrast   Low back pain potentially associated with radiculopathy    Exam consistent with muscle spasm .  Plain films ordered, limit use of naproxen given chronic nausea .disk space loss from L2 to S1 with mild osteophytic changes . PT referral in progress      Relevant Orders   Ambulatory referral to Physical Therapy   Nausea in adult    contineu PPI  Avoiding NSAIDs.  GI referral made  Lab Results  Component Value Date   ALT 27 03/24/2018   AST 33 03/24/2018   ALKPHOS 89 03/24/2018   BILITOT 0.5 03/24/2018         Polyarthritis    rheumatologic etiology unlikely with a normal ESR.   Lab Results  Component Value Date   ESRSEDRATE 30 03/24/2018         Relevant Orders   Sedimentation rate (Completed)   CBC with Differential/Platelet (Completed)   CK (Creatine Kinase) (Completed)    Other Visit Diagnoses    Cervical radiculitis    -  Primary   Relevant Orders   DG Cervical Spine Complete (Completed)   Chronic right-sided low back pain with right-sided sciatica       Relevant Orders   DG Lumbar Spine Complete (Completed)   Chronic left flank pain       Relevant Orders   Urinalysis, Routine w reflex microscopic (Completed)   Elevated blood pressure reading in office without diagnosis of hypertension       Relevant Orders   Microalbumin / creatinine  urine ratio (Completed)   Comprehensive metabolic panel (Completed)   Lipid  panel (Completed)   Chronic nausea       Relevant Orders   Comprehensive metabolic panel (Completed)   H Pylori, IGM, IGG, IGA AB     A total of 40 minutes was spent with patient more than half of which was spent in counseling patient on the above mentioned issues , reviewing and explaining recent labs and imaging studies done, and coordination of care.  I have discontinued Vonnie Spagnolo. [place]'s estradiol. I am also having her start on amLODipine. Additionally, I am having her maintain her acetaminophen, cetirizine, NAPROXEN PO, triamcinolone cream, clobetasol cream, omeprazole, ALPRAZolam, venlafaxine XR, and venlafaxine XR.  Meds ordered this encounter  Medications  . amLODipine (NORVASC) 2.5 MG tablet    Sig: Take 1 tablet (2.5 mg total) by mouth daily.    Dispense:  90 tablet    Refill:  0    Medications Discontinued During This Encounter  Medication Reason  . estradiol (ESTRACE VAGINAL) 0.1 MG/GM vaginal cream Error    Follow-up: Return in about 3 months (around 06/23/2018).   Crecencio Mc, MD

## 2018-03-24 NOTE — Assessment & Plan Note (Signed)
rheumatologic etiology unlikely with a normal ESR.   Lab Results  Component Value Date   ESRSEDRATE 30 03/24/2018

## 2018-03-24 NOTE — Patient Instructions (Signed)
Starting amlodipine 2.5 mg daily for blood pressure.  Goal is 130/80 or less  Medication will change if protein is present in urine  GI referral for endoscopy to determine cause of nausea   X rays today to determine if degenerative changes to spine are causing radiculopathy   You are up to date on all screenings (the purpose of your physical) so you do not need one this year.    I recommend a trial of cymbalta to help manage your chronic pain   Chronic Pain, Adult Chronic pain is a type of pain that lasts or keeps coming back (recurs) for at least six months. You may have chronic headaches, abdominal pain, or body pain. Chronic pain may be related to an illness, such as fibromyalgia or complex regional pain syndrome. Sometimes the cause of chronic pain is not known. Chronic pain can make it hard for you to do daily activities. If not treated, chronic pain can lead to other health problems, including anxiety and depression. Treatment depends on the cause and severity of your pain. You may need to work with a pain specialist to come up with a treatment plan. The plan may include medicine, counseling, and physical therapy. Many people benefit from a combination of two or more types of treatment to control their pain. Follow these instructions at home: Lifestyle  Consider keeping a pain diary to share with your health care providers.  Consider talking with a mental health care provider (psychologist) about how to cope with chronic pain.  Consider joining a chronic pain support group.  Try to control or lower your stress levels. Talk to your health care provider about strategies to do this. General instructions   Take over-the-counter and prescription medicines only as told by your health care provider.  Follow your treatment plan as told by your health care provider. This may include: ? Gentle, regular exercise. ? Eating a healthy diet that includes foods such as vegetables, fruits,  fish, and lean meats. ? Cognitive or behavioral therapy. ? Working with a Community education officer. ? Meditation or yoga. ? Acupuncture or massage therapy. ? Aroma, color, light, or sound therapy. ? Local electrical stimulation. ? Shots (injections) of numbing or pain-relieving medicines into the spine or the area of pain.  Check your pain level as told by your health care provider. Ask your health care provider if you should use a pain scale.  Learn as much as you can about how to manage your chronic pain. Ask your health care provider if an intensive pain rehabilitation program or a chronic pain specialist would be helpful.  Keep all follow-up visits as told by your health care provider. This is important. Contact a health care provider if:  Your pain gets worse.  You have new pain.  You have trouble sleeping.  You have trouble doing your normal activities.  Your pain is not controlled with treatment.  Your have side effects from pain medicine.  You feel weak. Get help right away if:  You lose feeling or have numbness in your body.  You lose control of bowel or bladder function.  Your pain suddenly gets much worse.  You develop shaking or chills.  You develop confusion.  You develop chest pain.  You have trouble breathing or shortness of breath.  You pass out.  You have thoughts about hurting yourself or others. This information is not intended to replace advice given to you by your health care provider. Make sure you discuss any questions  you have with your health care provider. Document Released: 12/30/2001 Document Revised: 12/08/2015 Document Reviewed: 09/27/2015 Elsevier Interactive Patient Education  [employer].

## 2018-03-24 NOTE — Assessment & Plan Note (Signed)
Degenerative changes seen at multiple  levels .

## 2018-03-24 NOTE — Assessment & Plan Note (Addendum)
Exam consistent with muscle spasm .  Plain films ordered, limit use of naproxen given chronic nausea .disk space loss from L2 to S1 with mild osteophytic changes . PT referral in progress

## 2018-03-24 NOTE — Assessment & Plan Note (Signed)
Incidental finding on cervical spine films,  Suspicious for neoplastic process.  CT ordered

## 2018-03-27 ENCOUNTER — Encounter: Payer: Self-pay | Admitting: Internal Medicine

## 2018-03-27 ENCOUNTER — Other Ambulatory Visit: Payer: Self-pay | Admitting: Internal Medicine

## 2018-03-27 ENCOUNTER — Telehealth: Payer: Self-pay | Admitting: Internal Medicine

## 2018-03-27 DIAGNOSIS — K2970 Gastritis, unspecified, without bleeding: Principal | ICD-10-CM

## 2018-03-27 DIAGNOSIS — B9681 Helicobacter pylori [H. pylori] as the cause of diseases classified elsewhere: Secondary | ICD-10-CM | POA: Insufficient documentation

## 2018-03-27 LAB — H PYLORI, IGM, IGG, IGA AB
H. pylori, IgA Abs: <9 units (ref 0.0–8.9)
H. pylori, IgG AbS: 0.95 Index Value — ABNORMAL HIGH (ref 0.00–0.79)

## 2018-03-27 MED ORDER — AMOXICILLIN 500 MG PO CAPS: 1000.0000 mg | ORAL_CAPSULE | Freq: Two times a day (BID) | ORAL | 0 refills | Status: AC

## 2018-03-27 MED ORDER — CLARITHROMYCIN 500 MG PO TABS: 500.0000 mg | ORAL_TABLET | Freq: Two times a day (BID) | ORAL | 0 refills | Status: AC

## 2018-03-27 NOTE — Assessment & Plan Note (Signed)
No prior treatment  will treat with clarithromycin 500 mg BID x 14 days,  Amoxicillin 1000 mg BID x 14 days  And omeprazole 40 mg bid x 14 days

## 2018-03-27 NOTE — Progress Notes (Signed)
Your  H Pylori test was positive.   This indicates that a bacteria commonly associated with ulcers may be contributing to your current abdominal and esophageal  Symptoms and should be treated with a two week course of antibiotics that I will call in to your pharmacy.  Please continue the stomach medication that I gave you as well, BUT YOU WILL NEED TO INCREASE THE DOSE OF OMEPRAZOLE TO 2 TIMES DAILY FOR 14 DAYS ) , because this is the 3 drug regiemn to treat H Pylori:  clarithromycin 500 mg twice daily x 14 days,   Amoxicillin 1000 mg (2 capsules)  Twice daily  x 14 days   And  40 mg omeprazole twice daily  x 14 days   If you do not feel better from a GI standpoint after this,  Call for  GI referral   Regards,   Deborra Medina, MD

## 2018-03-31 ENCOUNTER — Ambulatory Visit: Payer: BC Managed Care – PPO | Admitting: Psychiatry

## 2018-04-07 ENCOUNTER — Telehealth: Payer: Self-pay

## 2018-04-07 DIAGNOSIS — R918 Other nonspecific abnormal finding of lung field: Secondary | ICD-10-CM

## 2018-04-07 NOTE — Telephone Encounter (Signed)
Debbie from [place] called and stated that the pt is scheduled for a CT chest tomorrow but according to the radiologist the order needs to be changed to CT chest with contrast in order to see the hilar mass. Order has been changed.

## 2018-04-10 ENCOUNTER — Telehealth: Payer: Self-pay

## 2018-04-10 DIAGNOSIS — R918 Other nonspecific abnormal finding of lung field: Secondary | ICD-10-CM

## 2018-04-10 NOTE — Addendum Note (Signed)
Addended by: Crecencio Mc on: 04/10/2018 11:17 AM   Modules accepted: Orders

## 2018-04-10 NOTE — Telephone Encounter (Signed)
Result has been placed in yellow results folder.

## 2018-04-10 NOTE — Telephone Encounter (Signed)
Patient notified by me via phone that her chest CT is highly suspicious for lung CA.  Urgent referral to [facility] is in process.

## 2018-04-10 NOTE — Telephone Encounter (Signed)
Copied from [place] #[id number]. Topic: General - Other >> Apr 10, 2018  9:25 AM Keene Breath wrote: Reason for CRM: Patient called to check the status of her CT scan.  She stated that she was told that the results would be available within 24 hrs.  Please advise and call patient back at [phone] >> Apr 10, 2018  9:27 AM Keene Breath wrote: Patient called to check the status of her CT scan

## 2018-04-15 NOTE — Telephone Encounter (Signed)
She is scheduled on 12/31 @ 9:00 am with Dr. Waldemar Dickens at [facility]

## 2018-04-28 ENCOUNTER — Ambulatory Visit: Payer: BC Managed Care – PPO | Admitting: Physical Therapy

## 2018-04-30 ENCOUNTER — Encounter: Payer: BC Managed Care – PPO | Admitting: Physical Therapy

## 2018-05-05 ENCOUNTER — Encounter: Payer: BC Managed Care – PPO | Admitting: Physical Therapy

## 2018-05-07 ENCOUNTER — Encounter: Payer: BC Managed Care – PPO | Admitting: Physical Therapy

## 2018-05-12 ENCOUNTER — Encounter: Payer: BC Managed Care – PPO | Admitting: Physical Therapy

## 2018-05-14 ENCOUNTER — Encounter: Payer: BC Managed Care – PPO | Admitting: Physical Therapy

## 2018-05-19 ENCOUNTER — Encounter: Payer: BC Managed Care – PPO | Admitting: Physical Therapy

## 2018-05-21 ENCOUNTER — Encounter: Payer: BC Managed Care – PPO | Admitting: Physical Therapy

## 2018-06-02 ENCOUNTER — Ambulatory Visit: Payer: BC Managed Care – PPO | Admitting: Psychiatry

## 2018-06-02 ENCOUNTER — Telehealth: Payer: Self-pay

## 2018-06-02 NOTE — Telephone Encounter (Signed)
pt called states she has stafe 4 lung cancer and her treatments are on monday.  she would like for you to call her.  she wanted to find out if you can refer her to a psychiatrist in [place]/

## 2018-06-02 NOTE — Telephone Encounter (Signed)
Called Pt and referred her to [facility] - Dr Henderson [place]. She will call office for her medical records.

## 2018-06-25 ENCOUNTER — Ambulatory Visit: Payer: BC Managed Care – PPO | Admitting: Internal Medicine

## 2019-01-05 ENCOUNTER — Other Ambulatory Visit: Payer: Self-pay

## 2020-10-21 DEATH — deceased

## 2022-04-03 NOTE — Telephone Encounter (Signed)
MyChart messgae sent to patient.
